# Patient Record
Sex: Male | Born: 1965 | Race: White | Hispanic: No | Marital: Single | Smoking: Never smoker
Health system: Southern US, Community
[De-identification: ages and names within clinical notes are randomized; demographics above are authoritative.]

## PROBLEM LIST (undated history)

## (undated) DIAGNOSIS — K30 Functional dyspepsia: Secondary | ICD-10-CM

## (undated) DIAGNOSIS — E785 Hyperlipidemia, unspecified: Secondary | ICD-10-CM

## (undated) DIAGNOSIS — N529 Male erectile dysfunction, unspecified: Secondary | ICD-10-CM

## (undated) DIAGNOSIS — Z8249 Family history of ischemic heart disease and other diseases of the circulatory system: Secondary | ICD-10-CM

## (undated) DIAGNOSIS — C801 Malignant (primary) neoplasm, unspecified: Secondary | ICD-10-CM

## (undated) DIAGNOSIS — R931 Abnormal findings on diagnostic imaging of heart and coronary circulation: Secondary | ICD-10-CM

## (undated) DIAGNOSIS — J309 Allergic rhinitis, unspecified: Secondary | ICD-10-CM

## (undated) HISTORY — DX: Hyperlipidemia, unspecified: E78.5

## (undated) HISTORY — DX: Male erectile dysfunction, unspecified: N52.9

## (undated) HISTORY — DX: Allergic rhinitis, unspecified: J30.9

## (undated) HISTORY — DX: Malignant (primary) neoplasm, unspecified: C80.1

## (undated) HISTORY — DX: Abnormal findings on diagnostic imaging of heart and coronary circulation: R93.1

## (undated) HISTORY — DX: Family history of ischemic heart disease and other diseases of the circulatory system: Z82.49

---

## 2002-10-03 ENCOUNTER — Ambulatory Visit (HOSPITAL_COMMUNITY): Admission: RE | Admit: 2002-10-03 | Discharge: 2002-10-03 | Payer: Self-pay | Admitting: Gastroenterology

## 2002-10-03 ENCOUNTER — Encounter (INDEPENDENT_AMBULATORY_CARE_PROVIDER_SITE_OTHER): Payer: Self-pay | Admitting: Specialist

## 2004-06-17 ENCOUNTER — Encounter: Admission: RE | Admit: 2004-06-17 | Discharge: 2004-06-17 | Payer: Self-pay | Admitting: Family Medicine

## 2005-07-31 HISTORY — PX: COLONOSCOPY: SHX174

## 2006-06-08 ENCOUNTER — Ambulatory Visit: Payer: Self-pay | Admitting: Family Medicine

## 2006-06-20 ENCOUNTER — Ambulatory Visit: Payer: Self-pay | Admitting: Family Medicine

## 2006-08-02 ENCOUNTER — Ambulatory Visit: Payer: Self-pay | Admitting: Family Medicine

## 2006-08-21 ENCOUNTER — Ambulatory Visit: Payer: Self-pay | Admitting: Family Medicine

## 2006-08-22 ENCOUNTER — Encounter: Admission: RE | Admit: 2006-08-22 | Discharge: 2006-08-22 | Payer: Self-pay | Admitting: Family Medicine

## 2006-09-18 ENCOUNTER — Ambulatory Visit: Payer: Self-pay | Admitting: Family Medicine

## 2006-11-12 ENCOUNTER — Ambulatory Visit: Payer: Self-pay | Admitting: Family Medicine

## 2007-02-11 ENCOUNTER — Ambulatory Visit: Payer: Self-pay | Admitting: Family Medicine

## 2008-04-07 ENCOUNTER — Ambulatory Visit: Payer: Self-pay | Admitting: Family Medicine

## 2008-09-29 ENCOUNTER — Ambulatory Visit: Payer: Self-pay | Admitting: Family Medicine

## 2008-12-07 ENCOUNTER — Ambulatory Visit: Payer: Self-pay | Admitting: Family Medicine

## 2009-02-08 ENCOUNTER — Ambulatory Visit: Payer: Self-pay | Admitting: Family Medicine

## 2009-06-29 ENCOUNTER — Ambulatory Visit: Payer: Self-pay | Admitting: Family Medicine

## 2009-08-23 ENCOUNTER — Ambulatory Visit: Payer: Self-pay | Admitting: Family Medicine

## 2009-09-06 ENCOUNTER — Encounter: Admission: RE | Admit: 2009-09-06 | Discharge: 2009-09-06 | Payer: Self-pay | Admitting: Family Medicine

## 2009-09-15 ENCOUNTER — Ambulatory Visit: Payer: Self-pay | Admitting: Family Medicine

## 2009-12-08 ENCOUNTER — Ambulatory Visit: Payer: Self-pay | Admitting: Family Medicine

## 2010-09-16 ENCOUNTER — Ambulatory Visit (INDEPENDENT_AMBULATORY_CARE_PROVIDER_SITE_OTHER): Payer: 59 | Admitting: Family Medicine

## 2010-09-16 DIAGNOSIS — J359 Chronic disease of tonsils and adenoids, unspecified: Secondary | ICD-10-CM

## 2010-09-16 DIAGNOSIS — J069 Acute upper respiratory infection, unspecified: Secondary | ICD-10-CM

## 2010-10-17 ENCOUNTER — Ambulatory Visit: Payer: 59 | Admitting: Family Medicine

## 2010-12-16 NOTE — Op Note (Signed)
   NAME:  Adrian Mcneil, Adrian Mcneil                    ACCOUNT NO.:  [id number]   MEDICAL RECORD NO.:  [id number]                   PATIENT TYPE:  AMB   LOCATION:  ENDO                                 FACILITY:  [facility]   PHYSICIAN:  James L. Rolla Flatten., M.D.          DATE OF BIRTH:  1965-12-31   DATE OF PROCEDURE:  10/03/2002  DATE OF DISCHARGE:                                 OPERATIVE REPORT   PROCEDURE:  Colonoscopy and biopsy.   MEDICATIONS:  Fentanyl 125 mcg, Versed 12 mg IV.   SCOPE:  Olympus adult pediatric colonoscope.   INDICATIONS FOR PROCEDURE:  A patient with persistent diarrhea, rectal  bleeding, lots of gas. For this reason, colonoscopy is performed.   DESCRIPTION OF PROCEDURE:  The procedure had been explained to the patient  and consent obtained. With the patient in the left lateral decubitus  position, the Olympus scope was inserted and advanced under direct  visualization. The prep was quite good.  The patient immediately was seen to  have a very edematous friable colonic mucosa with what appeared to be either  mucus or exudate but no frank ulcerations. This process extended throughout  the entire colon. The ileocecal valve was identified and entered. The  terminal ileum did not appear to be normal, it appeared to be edematous and  friable. Several biopsies were taken of the terminal ileum. No ulcerations  were seen. These were placed in jar #1. The scope was then withdrawn and the  mucosa photographed throughout. Multiple biopsies were obtained and placed  in jar #2. These were random colon biopsies. The process extended throughout  the entire colon. There was complete loss of vascular pattern with mucosal  edema friability with what appeared to be exudate but no frank ulcerations.  The patient tolerated the procedure well and was maintained on low flow  oxygen and pulse oximeter throughout the procedure.   ASSESSMENT:  Probable some variant of inflammatory bowel  disease.   PLAN:  Will begin the patient on Asacol, check path results and see him in  back in our office in 3-4 weeks.                                               James L. Rolla Flatten., M.D.    Jaynie Bream  D:  10/03/2002  T:  10/03/2002  Job:  [id number]   cc:   Jill Alexanders, M.D.  [address] [place], [place] [address]  Fax: [phone]

## 2011-02-20 ENCOUNTER — Other Ambulatory Visit: Payer: Self-pay | Admitting: Family Medicine

## 2011-07-24 ENCOUNTER — Ambulatory Visit (INDEPENDENT_AMBULATORY_CARE_PROVIDER_SITE_OTHER): Payer: 59 | Admitting: Family Medicine

## 2011-07-24 ENCOUNTER — Encounter: Payer: Self-pay | Admitting: Family Medicine

## 2011-07-24 DIAGNOSIS — Z202 Contact with and (suspected) exposure to infections with a predominantly sexual mode of transmission: Secondary | ICD-10-CM

## 2011-07-24 DIAGNOSIS — Z23 Encounter for immunization: Secondary | ICD-10-CM

## 2011-07-24 NOTE — Progress Notes (Signed)
CC: STD check HPI:  Patient presents for STD check.  Has had oral sex with new partner.  Did not use condoms.  Partner mentioned some tingling discomfort with urinating.  Denies burning with urination, urgency, frequency or penile discharge.  Denies any sores or lesions.  Past Medical History  Diagnosis Date  . Ulcerative colitis     Dr. Oletta Lamas  . Dyslipidemia   . Allergic rhinitis, cause unspecified     seasonal  . Erectile dysfunction    Past Surgical History  Procedure Date  . Colonoscopy 2007    Dr. Oletta Lamas   Current Outpatient Prescriptions on File Prior to Visit  Medication Sig Dispense Refill  . ASACOL 400 MG EC tablet TAKE 2 TABLET BY MOUTH TWICE A DAY CALL FOR APPOINTMENT!!  120 tablet  0   No Known Allergies  ROS:  Denies fevers, URI symptoms, cough, shortness of breath, skin lesions, dysuria, or other concerns  PHYSICAL EXAM: BP 132/100  Pulse 100  Resp 16  Wt 174 lb (78.926 kg) Pleasant male, in no distress HEENT: PERRL, EOMI, conjunctiva clear.  OP clear without erythema or lesions Neck: no lymphadenopathy  ASSESSMENT/PLAN: 1. Exposure to STD  HIV Antibody ( Reflex), RPR, GC/chlamydia probe amp, urine  2. Need for influenza vaccination  Flu vaccine greater than or equal to 3yo preservative free IM   HIV, RPR, GC, chlamydia Encouraged safe sex.  Discussed potential risks with unprotected oral sex.  Lipids not checked today due to him being nonfasting (had [employer]) and his TG was elevated in past.  Recommended he return for CPE with Dr. Redmond School, to come fasting for routine labs then.  Due to f/u with GI for colonoscopy.

## 2011-07-24 NOTE — Patient Instructions (Signed)
You are due for a physical with Dr. Redmond School. I believe you are also due to follow up with Dr. Oletta Lamas for colonoscopy  Please try and practice safe sex

## 2011-07-25 LAB — RPR

## 2011-07-26 LAB — GC/CHLAMYDIA PROBE AMP, URINE: Chlamydia, Swab/Urine, PCR: NEGATIVE

## 2011-08-10 ENCOUNTER — Encounter: Payer: Self-pay | Admitting: Internal Medicine

## 2011-08-18 ENCOUNTER — Telehealth: Payer: Self-pay | Admitting: Family Medicine

## 2011-08-18 MED ORDER — VARDENAFIL HCL 20 MG PO TABS: 20.0000 mg | ORAL_TABLET | Freq: Every day | ORAL | Status: DC | PRN

## 2011-08-18 NOTE — Telephone Encounter (Signed)
Levitra renewed

## 2011-08-22 ENCOUNTER — Ambulatory Visit (INDEPENDENT_AMBULATORY_CARE_PROVIDER_SITE_OTHER): Payer: 59 | Admitting: Family Medicine

## 2011-08-22 ENCOUNTER — Encounter: Payer: Self-pay | Admitting: Family Medicine

## 2011-08-22 VITALS — BP 126/70 | HR 104 | Ht 71.0 in | Wt 175.0 lb

## 2011-08-22 DIAGNOSIS — J301 Allergic rhinitis due to pollen: Secondary | ICD-10-CM

## 2011-08-22 DIAGNOSIS — E785 Hyperlipidemia, unspecified: Secondary | ICD-10-CM | POA: Insufficient documentation

## 2011-08-22 DIAGNOSIS — Z0000 Encounter for general adult medical examination without abnormal findings: Secondary | ICD-10-CM

## 2011-08-22 DIAGNOSIS — Z8249 Family history of ischemic heart disease and other diseases of the circulatory system: Secondary | ICD-10-CM | POA: Insufficient documentation

## 2011-08-22 DIAGNOSIS — N529 Male erectile dysfunction, unspecified: Secondary | ICD-10-CM | POA: Insufficient documentation

## 2011-08-22 DIAGNOSIS — K5190 Ulcerative colitis, unspecified, without complications: Secondary | ICD-10-CM | POA: Insufficient documentation

## 2011-08-22 NOTE — Progress Notes (Signed)
Subjective:    Patient ID: Adrian Mcneil, male    DOB: [birth year], 46 y.o.   MRN: [id number]  HPI He is here for a complete examination. He is scheduled to see his gastroenterologist for followup on his underlying ulcerative colitis. Presently he is not taking any medications for this and states he is not having any trouble except for occasional bouts of hemorrhoids. They do occasionally bleed. He does not suffer from constipation. There is a family history of heart disease and presently he is on no statins. He has had difficulty with upper back discomfort. He is seeing a chiropractor and getting some benefit out of this. He does not smoke and does drink socially. Presently is not sexually active. He does have a previous history of erectile dysfunction. His allergies are under good control.   Review of Systems Negative except as above    Objective:   Physical Exam BP 126/70  Pulse 104  Ht 5' 11"  (1.803 m)  Wt 175 lb (79.379 kg)  BMI 24.41 kg/m2  General Appearance:    Alert, cooperative, no distress, appears stated age  Head:    Normocephalic, without obvious abnormality, atraumatic  Eyes:    PERRL, conjunctiva/corneas clear, EOM's intact, fundi    benign  Ears:    Normal TM's and external ear canals  Nose:   Nares normal, mucosa normal, no drainage or sinus   tenderness  Throat:   Lips, mucosa, and tongue normal; teeth and gums normal  Neck:   Supple, no lymphadenopathy;  thyroid:  no   enlargement/tenderness/nodules; no carotid   bruit or JVD  Back:    Spine nontender, no curvature, ROM normal, no CVA     tenderness  Lungs:     Clear to auscultation bilaterally without wheezes, rales or     ronchi; respirations unlabored  Chest Wall:    No tenderness or deformity   Heart:    Regular rate and rhythm, S1 and S2 normal, no murmur, rub   or gallop  Breast Exam:    No chest wall tenderness, masses or gynecomastia  Abdomen:     Soft, non-tender, nondistended, normoactive bowel  sounds,    no masses, no hepatosplenomegaly  Genitalia:    Normal male external genitalia without lesions.  Testicles without masses.  No inguinal hernias.  Rectal:    pedunculated lesions noted in the anal area there do not appear to be true hemorrhoids.   Extremities:   No clubbing, cyanosis or edema  Pulses:   2+ and symmetric all extremities  Skin:   Skin color, texture, turgor normal, no rashes or lesions  Lymph nodes:   Cervical, supraclavicular, and axillary nodes normal  Neurologic:   CNII-XII intact, normal strength, sensation and gait; reflexes 2+ and symmetric throughout          Psych:   Normal mood, affect, hygiene and grooming.           Assessment & Plan:   1. Routine general medical examination at a health care facility  CBC with Differential, Comprehensive metabolic panel, Lipid panel  2. Family history of heart disease in male family member before age 38    3. Ulcerative colitis    4. Hyperlipidemia LDL goal < 100  Lipid panel  5. ED (erectile dysfunction)    6. Allergic rhinitis due to pollen     he will followup with his gastroenterologist. I recommended that he ask the gastroenterologist about the lesions in the rectal area. Recommend  he continue with stretching exercises for his neck and back. Also discussed proper posturing. We discussed erectile dysfunction and at this time will defer any medications until a later date when it becomes more sexually active.

## 2011-08-23 LAB — LIPID PANEL
LDL Cholesterol: 173 mg/dL — ABNORMAL HIGH (ref 0–99)
Total CHOL/HDL Ratio: 5.6 Ratio
Triglycerides: 114 mg/dL (ref <150)
VLDL: 23 mg/dL (ref 0–40)

## 2011-08-23 LAB — CBC WITH DIFFERENTIAL/PLATELET
Basophils Relative: 0 % (ref 0–1)
Eosinophils Absolute: 0.1 10*3/uL (ref 0.0–0.7)
HCT: 42.5 % (ref 39.0–52.0)
MCH: 30.3 pg (ref 26.0–34.0)
MCHC: 33.9 g/dL (ref 30.0–36.0)
MCV: 89.5 fL (ref 78.0–100.0)
Monocytes Absolute: 0.7 10*3/uL (ref 0.1–1.0)
Neutro Abs: 6.2 10*3/uL (ref 1.7–7.7)
Neutrophils Relative %: 70 % (ref 43–77)
Platelets: 284 10*3/uL (ref 150–400)
RBC: 4.75 MIL/uL (ref 4.22–5.81)
RDW: 12.6 % (ref 11.5–15.5)
WBC: 8.9 10*3/uL (ref 4.0–10.5)

## 2011-08-23 LAB — COMPREHENSIVE METABOLIC PANEL
ALT: 16 U/L (ref 0–53)
AST: 17 U/L (ref 0–37)
Albumin: 4.6 g/dL (ref 3.5–5.2)
Alkaline Phosphatase: 87 U/L (ref 39–117)
CO2: 28 mEq/L (ref 19–32)
Calcium: 9.6 mg/dL (ref 8.4–10.5)
Chloride: 100 mEq/L (ref 96–112)
Glucose, Bld: 83 mg/dL (ref 70–99)
Potassium: 4.8 mEq/L (ref 3.5–5.3)
Sodium: 139 mEq/L (ref 135–145)
Total Bilirubin: 0.7 mg/dL (ref 0.3–1.2)
Total Protein: 7.8 g/dL (ref 6.0–8.3)

## 2011-08-23 NOTE — Progress Notes (Signed)
Quick Note:  The blood work is normal ______

## 2011-08-31 ENCOUNTER — Other Ambulatory Visit: Payer: Self-pay | Admitting: Gastroenterology

## 2011-10-09 ENCOUNTER — Ambulatory Visit (INDEPENDENT_AMBULATORY_CARE_PROVIDER_SITE_OTHER): Payer: 59 | Admitting: General Surgery

## 2011-10-09 ENCOUNTER — Encounter (INDEPENDENT_AMBULATORY_CARE_PROVIDER_SITE_OTHER): Payer: Self-pay | Admitting: General Surgery

## 2011-10-09 VITALS — BP 126/74 | HR 106 | Temp 98.6°F | Ht 71.0 in | Wt 177.8 lb

## 2011-10-09 DIAGNOSIS — K644 Residual hemorrhoidal skin tags: Secondary | ICD-10-CM

## 2011-10-09 NOTE — Patient Instructions (Signed)
Your examination today reveals circumferential, but non-inflamed external hemorrhoids. Internal exam doesn't reveal much in the way of abnormality.  You will be scheduled for surgery to remove the external hemorrhoids.  Hemorrhoidectomy Hemorrhoidectomy is surgery to remove hemorrhoids. Hemorrhoids are veins that have become swollen in the rectum. The rectum is the area from the bottom end of the intestines to the opening where bowel movements leave the body. Hemorrhoids can be uncomfortable. They can cause itching, bleeding and pain if a blood clot forms in them (thrombose). If hemorrhoids are small, surgery may not be needed. But if they cover a larger area, surgery is usually suggested.  LET YOUR CAREGIVER KNOW ABOUT:   Any allergies.   All medications you are taking, including:   Herbs, eyedrops, over-the-counter medications and creams.   Blood thinners (anticoagulants), aspirin or other drugs that could affect blood clotting.   Use of steroids (by mouth or as creams).   Previous problems with anesthetics, including local anesthetics.   Possibility of pregnancy, if this applies.   Any history of blood clots.   Any history of bleeding or other blood problems.   Previous surgery.   Smoking history.   Other health problems.  RISKS AND COMPLICATIONS All surgery carries some risk. However, hemorrhoid surgery usually goes smoothly. Possible complications could include:  Urinary retention.   Bleeding.   Infection.   A painful incision.   A reaction to the anesthesia (this is not common).  BEFORE THE PROCEDURE   Stop using aspirin and non-steroidal anti-inflammatory drugs (NSAIDs) for pain relief. This includes prescription drugs and over-the-counter drugs such as ibuprofen and naproxen. Also stop taking vitamin E. If possible, do this two weeks before your surgery.   If you take blood-thinners, ask your healthcare provider when you should stop taking them.   You will  probably have blood and urine tests done several days before your surgery.   Do not eat or drink for about 8 hours before the surgery.   Arrive at least an hour before the surgery, or whenever your surgeon recommends. This will give you time to check in and fill out any needed paperwork.   Hemorrhoidectomy is often an outpatient procedure. This means you will be able to go home the same day. Sometimes, though, people stay overnight in the hospital after the procedure. Ask your surgeon what to expect. Either way, make arrangements in advance for someone to drive you home.  PROCEDURE   The preparation:   You will change into a hospital gown.   You will be given an IV. A needle will be inserted in your arm. Medication can flow directly into your body through this needle.   You might be given an enema to clear your rectum.   Once in the operating room, you will probably lie on your side or be repositioned later to lying on your stomach.   You will be given anesthesia (medication) so you will not feel anything during the surgery. The surgery often is done with local anesthesia (the area near the hemorrhoids will be numb and you will be drowsy but awake). Sometimes, general anesthesia is used (you will be asleep during the procedure).   The procedure:   There are a few different procedures for hemorrhoids. Be sure to ask you surgeon about the procedure, the risks and benefits.   Be sure to ask about what you need to do to take care of the wound, if there is one.  AFTER THE PROCEDURE  You will stay in a recovery area until the anesthesia has worn off. Your blood pressure and pulse will be checked every so often.   You may feel a lot of pain in the area of the rectum.   Take all pain medication prescribed by your surgeon. Ask before taking any over-the-counter pain medicines.   Sometimes sitting in a warm bath can help relieve your pain.   To make sure you have bowel movements without  straining:   You will probably need to take stool softeners (usually a pill) for a few days.   You should drink 8 to 10 glasses of water each day.   Your activity will be restricted for awhile. Ask your caregiver for a list of what you should and should not do while you recover.  Document Released: 05/14/2009 Document Revised: 07/06/2011 Document Reviewed: 05/14/2009 [facility] Patient Information 2012 [place].

## 2011-10-09 NOTE — Progress Notes (Signed)
Patient ID: Adrian Mcneil, male   DOB: 1965-12-20, 46 y.o.   MRN: [id number]  Chief Complaint  Patient presents with  . Pre-op Exam    eval anal polyps   . HPI Adrian Mcneil is a 46 y.o. male.  He is referred by Dr. Laurence Spates for evaluation of anal polyps and hemorrhoids. Dr. Jill Alexanders is his primary care physician.  This patient has ulcerative colitis, which was diagnosed in 2004. He has intermittently been on medication for this. He is not very symptomatic from that.  His complaints are external skin tags and or polyps. These do not retract. They are not prolapsing. They are always external. Occasionally they will become engorged and will bleed and are irritated. He uses cream to keep it moist and avoid this.  Recent colonoscopy on August 31, 2011 reveals chronic active colitis both right and left side but no dysplasia. There were no internal hemorrhoids on colonoscopy. He was started back on Asacol at that time.  He would like to have something done about the external polyps or hemorrhoids. HPI  Past Medical History  Diagnosis Date  . Ulcerative colitis     Dr. Oletta Lamas  . Dyslipidemia   . Allergic rhinitis, cause unspecified     seasonal  . Erectile dysfunction   . FHx: cardiovascular disease   . Hyperlipidemia   . Cancer     skin    Past Surgical History  Procedure Date  . Colonoscopy 2007    Dr. Oletta Lamas    Family History  Problem Relation Age of Onset  . Hyperlipidemia Mother   . Heart disease Father 79    MI  . Cancer Sister     skin    Social History History  Substance Use Topics  . Smoking status: Never Smoker   . Smokeless tobacco: Never Used  . Alcohol Use: Yes     socially, 1-2 drinks once or twice a week    No Known Allergies  Current Outpatient Prescriptions  Medication Sig Dispense Refill  . ASACOL 400 MG EC tablet TAKE 2 TABLET BY MOUTH TWICE A DAY CALL FOR APPOINTMENT!!  120 tablet  0    Review of Systems Review of  Systems  Constitutional: Negative for fever, chills and unexpected weight change.  HENT: Negative for hearing loss, congestion, sore throat, trouble swallowing and voice change.   Eyes: Negative for visual disturbance.  Respiratory: Negative for cough and wheezing.   Cardiovascular: Negative for chest pain, palpitations and leg swelling.  Gastrointestinal: Positive for anal bleeding. Negative for nausea, vomiting, abdominal pain, diarrhea, constipation, blood in stool, abdominal distention and rectal pain.  Genitourinary: Negative for hematuria and difficulty urinating.  Musculoskeletal: Negative for arthralgias.  Skin: Negative for rash and wound.  Neurological: Negative for seizures, syncope, weakness and headaches.  Hematological: Negative for adenopathy. Does not bruise/bleed easily.  Psychiatric/Behavioral: Negative for confusion.    Blood pressure 126/74, pulse 106, temperature 98.6 F (37 C), temperature source Temporal, height 5' 11"  (1.803 m), weight 177 lb 12.8 oz (80.65 kg), SpO2 98.00%.  Physical Exam Physical Exam  Constitutional: He is oriented to person, place, and time. He appears well-developed and well-nourished. No distress.  HENT:  Head: Normocephalic.  Nose: Nose normal.  Mouth/Throat: No oropharyngeal exudate.  Eyes: Conjunctivae and EOM are normal. Pupils are equal, round, and reactive to light. Right eye exhibits no discharge. Left eye exhibits no discharge. No scleral icterus.  Neck: Normal range of motion. Neck supple. No  JVD present. No tracheal deviation present. No thyromegaly present.  Cardiovascular: Normal rate, regular rhythm, normal heart sounds and intact distal pulses.   No murmur heard. Pulmonary/Chest: Effort normal and breath sounds normal. No stridor. No respiratory distress. He has no wheezes. He has no rales. He exhibits no tenderness.  Abdominal: Soft. Bowel sounds are normal. He exhibits no distension and no mass. There is no tenderness. There  is no rebound and no guarding.  Genitourinary:       Circumferential external hemorrhoids. These are not inflamed, not thrombosed. Not very large. Digital rectal exam reveals normal sphincter tone, no mass no tenderness. Anoscopy reveals fairly normal anorectal mucosa. Minimal internal hemorrhoids.  Musculoskeletal: Normal range of motion. He exhibits no edema and no tenderness.  Lymphadenopathy:    He has no cervical adenopathy.  Neurological: He is alert and oriented to person, place, and time. He has normal reflexes. Coordination normal.  Skin: Skin is warm and dry. No rash noted. He is not diaphoretic. No erythema. No pallor.  Psychiatric: He has a normal mood and affect. His behavior is normal. Judgment and thought content normal.    Data Reviewed  I reviewed Dr. Oletta Lamas office notes, pathology reports, a recent colonoscopy report. Assessment    External hemorrhoids with complication of inflammation and bleeding.  Minimal, if any internal hemorrhoids. Suspect his symptoms are due to the external hemorrhoids.  Ulcerative colitis without dysplasia, on asacol  Otherwise healthy    Plan    We discussed options for management. He clearly desires to have these areas excised, which I think is reasonable.  Will plan for examination under anesthesia, excision of external hemorrhoids as an outpatient.  I have discussed the indications and details of surgery with him. Risks and complications have been outlined, including but not limited to bleeding, infection, recurrence, sphincter damage, and other unspecified problems. He knows that he does not heal quite as well as an average person because of his ulcerative colitis. All questions are answered. He understands these issues. He agrees with the plan.       [name], M.D., [facility] General and Minimally invasive Surgery Breast and Colorectal Surgery Office:   [phone] Pager:    [phone]  10/09/2011, 11:38 AM

## 2011-11-02 ENCOUNTER — Telehealth (INDEPENDENT_AMBULATORY_CARE_PROVIDER_SITE_OTHER): Payer: Self-pay

## 2011-11-02 NOTE — Telephone Encounter (Signed)
Patient called concerning pre op labs and UA that need to be done. He said he cannot make it to get these done before surgery on 4/12. He said he had lab work done in January by PCP and is having those faxed over to surgical center to see if they can use those instead of having new labs drawn. I told him I would get this message to Dr. Dalbert Batman and his nurse and they would call you with what needs to be done. Best number to reach him at is [phone].

## 2011-11-03 ENCOUNTER — Telehealth (INDEPENDENT_AMBULATORY_CARE_PROVIDER_SITE_OTHER): Payer: Self-pay

## 2011-11-03 NOTE — Telephone Encounter (Signed)
Pt advised ok with Dr Dalbert Batman to use Jan labs if anesthesia is ok with them. Pt states they have been sent to surg center for review.

## 2011-11-03 NOTE — Telephone Encounter (Signed)
Message copied by Dois Davenport on Fri Nov 03, 2011  1:56 PM ------      Message from: Adin Hector      Created: Fri Nov 03, 2011  1:44 PM       I'm OK with Jan. Labs if anesthesia is.                  ----- Message -----         From: Dois Davenport, LPN         Sent: 01/01/159   9:26 AM           To: Adin Hector, MD            [name] had sent to our box yesterday morning a note wanting to know if ok for pt to use his Jan labs from his pcp as his pre op labs. Pt could not get off work to have pre op labs drawn. I saw where to note was routed to you but did not see it or an answer in your in box. If anesthesia is ok with them can we not redraw labs? Surgery is 4-12.

## 2011-11-10 ENCOUNTER — Other Ambulatory Visit (INDEPENDENT_AMBULATORY_CARE_PROVIDER_SITE_OTHER): Payer: Self-pay | Admitting: General Surgery

## 2011-11-10 DIAGNOSIS — K648 Other hemorrhoids: Secondary | ICD-10-CM

## 2011-11-10 DIAGNOSIS — K644 Residual hemorrhoidal skin tags: Secondary | ICD-10-CM

## 2011-11-13 ENCOUNTER — Telehealth (INDEPENDENT_AMBULATORY_CARE_PROVIDER_SITE_OTHER): Payer: Self-pay | Admitting: General Surgery

## 2011-11-13 NOTE — Telephone Encounter (Signed)
Pt calling to ask is there is anything to apply locally for pain.  He is using the warm tub soaks, and taking Miralax, but he "is raw."  He is using baby wipes, as recommended, too.  Pt states his last pain meds were 24 hours ago, because of the constipation side effects.  Reiterated the things he is doing now to continue doing (tub soaks, Miralax and baby wipes); added he can also try A&D ointment externally only.  Advised to resume the po pain meds.

## 2011-11-14 ENCOUNTER — Encounter (INDEPENDENT_AMBULATORY_CARE_PROVIDER_SITE_OTHER): Payer: Self-pay | Admitting: General Surgery

## 2011-11-15 ENCOUNTER — Telehealth (INDEPENDENT_AMBULATORY_CARE_PROVIDER_SITE_OTHER): Payer: Self-pay

## 2011-11-15 NOTE — Telephone Encounter (Signed)
LMOM f/u appt 12-08-11 arrive 10:15am.

## 2011-11-20 ENCOUNTER — Telehealth (INDEPENDENT_AMBULATORY_CARE_PROVIDER_SITE_OTHER): Payer: Self-pay

## 2011-11-20 NOTE — Telephone Encounter (Signed)
Pt states he has an open area at anus where suture has come out. No swelling. No fever. No drainage.  He wanted to know if this was to be expected. Pt advised that the sutures will dissolve and come out as time goes on and he may have small raw feeling area where the sutures were. Pt states he has been reading online about wound healing and wants to know if applying antibiotic or A&D ointment will help since area sore if it gets dry. I advised pt to use A&D in a very small amount on area if it gives him relief. Pt advised to be sure to continue tub soaks at least twice a day and use baby wet wipes to clean area. Pt to call with any concerns.

## 2011-11-24 ENCOUNTER — Encounter: Payer: Self-pay | Admitting: Gastroenterology

## 2011-12-04 ENCOUNTER — Encounter: Payer: Self-pay | Admitting: Gastroenterology

## 2011-12-08 ENCOUNTER — Encounter (INDEPENDENT_AMBULATORY_CARE_PROVIDER_SITE_OTHER): Payer: 59 | Admitting: General Surgery

## 2012-02-15 ENCOUNTER — Encounter (INDEPENDENT_AMBULATORY_CARE_PROVIDER_SITE_OTHER): Payer: Self-pay

## 2012-06-11 ENCOUNTER — Ambulatory Visit (INDEPENDENT_AMBULATORY_CARE_PROVIDER_SITE_OTHER): Payer: 59 | Admitting: Family Medicine

## 2012-06-11 ENCOUNTER — Encounter: Payer: Self-pay | Admitting: Family Medicine

## 2012-06-11 VITALS — BP 116/80 | HR 71 | Wt 176.0 lb

## 2012-06-11 DIAGNOSIS — Z23 Encounter for immunization: Secondary | ICD-10-CM

## 2012-06-11 DIAGNOSIS — N529 Male erectile dysfunction, unspecified: Secondary | ICD-10-CM

## 2012-06-11 DIAGNOSIS — E785 Hyperlipidemia, unspecified: Secondary | ICD-10-CM

## 2012-06-11 DIAGNOSIS — Z8249 Family history of ischemic heart disease and other diseases of the circulatory system: Secondary | ICD-10-CM

## 2012-06-11 LAB — CBC WITH DIFFERENTIAL/PLATELET
Basophils Absolute: 0.1 10*3/uL (ref 0.0–0.1)
Basophils Relative: 1 % (ref 0–1)
Eosinophils Absolute: 0.2 10*3/uL (ref 0.0–0.7)
Eosinophils Relative: 2 % (ref 0–5)
HCT: 41.6 % (ref 39.0–52.0)
Hemoglobin: 14.3 g/dL (ref 13.0–17.0)
Lymphocytes Relative: 25 % (ref 12–46)
Lymphs Abs: 2.2 10*3/uL (ref 0.7–4.0)
MCH: 30.6 pg (ref 26.0–34.0)
MCV: 89.1 fL (ref 78.0–100.0)
Monocytes Absolute: 0.7 10*3/uL (ref 0.1–1.0)
Monocytes Relative: 8 % (ref 3–12)
Neutro Abs: 5.4 10*3/uL (ref 1.7–7.7)
Neutrophils Relative %: 64 % (ref 43–77)
Platelets: 251 10*3/uL (ref 150–400)
RBC: 4.67 MIL/uL (ref 4.22–5.81)
RDW: 13 % (ref 11.5–15.5)
WBC: 8.5 10*3/uL (ref 4.0–10.5)

## 2012-06-11 NOTE — Progress Notes (Signed)
  Subjective:    Patient ID: Adrian Mcneil, male    DOB: 1966/07/30, 46 y.o.   MRN: [id number]  HPI He is here for consult concerning cardiovascular risk stratification. His father did have an MI at age 29. Review his record indicates he did have a slightly abnormal EKG however cardiac evaluation was negative. He was also placed on a statin however did not take it but is now taking red yeast rice. He also is had some intermittent difficulty with erectile dysfunction but has had unacceptable side effects of nasal congestion as well as backache. He has had no chest pain, shortness of breath, DOE or PND.   Review of Systems     Objective:   Physical Exam Alert and in no distress. Cardiac exam shows a regular sinus rhythm without murmurs or gallops. Lungs clear to auscultation. EKG shows no acute changes.       Assessment & Plan:   1. Hyperlipidemia LDL goal < 100  Lipid panel, CBC with Differential, Comprehensive metabolic panel  2. Family history of heart disease in male family member before age 64  Lipid panel, PR ELECTROCARDIOGRAM, COMPLETE, Flu vaccine greater than or equal to 3yo preservative free IM  3. ED (erectile dysfunction)     sample of Cialis given with risks and benefits described. We'll also give him a flu shot. Risk and benefits discussed. Discussed the possibility of referring him to cardiology. Explained that there was no particular protocol however he is really having no cardiac related symptoms at this time.

## 2012-06-12 LAB — COMPREHENSIVE METABOLIC PANEL
AST: 23 U/L (ref 0–37)
Albumin: 4.4 g/dL (ref 3.5–5.2)
Alkaline Phosphatase: 97 U/L (ref 39–117)
BUN: 16 mg/dL (ref 6–23)
CO2: 29 mEq/L (ref 19–32)
Calcium: 9.5 mg/dL (ref 8.4–10.5)
Chloride: 102 mEq/L (ref 96–112)
Creat: 0.94 mg/dL (ref 0.50–1.35)
Glucose, Bld: 81 mg/dL (ref 70–99)
Potassium: 4.4 mEq/L (ref 3.5–5.3)
Sodium: 139 mEq/L (ref 135–145)
Total Bilirubin: 0.4 mg/dL (ref 0.3–1.2)
Total Protein: 7.5 g/dL (ref 6.0–8.3)

## 2012-06-12 LAB — LIPID PANEL
Cholesterol: 213 mg/dL — ABNORMAL HIGH (ref 0–200)
HDL: 43 mg/dL (ref >39)
LDL Cholesterol: 112 mg/dL — ABNORMAL HIGH (ref 0–99)
VLDL: 58 mg/dL — ABNORMAL HIGH (ref 0–40)

## 2012-06-12 NOTE — Progress Notes (Signed)
Quick Note:  Let him know that the LDL is lower but the triglycerides went up. Send him triglyceride lowering information ______

## 2012-06-21 ENCOUNTER — Other Ambulatory Visit: Payer: Self-pay

## 2012-06-21 DIAGNOSIS — Z8639 Personal history of other endocrine, nutritional and metabolic disease: Secondary | ICD-10-CM

## 2012-06-21 NOTE — Progress Notes (Signed)
Pt last office visit he said he had eaten 4 hrs prior he is going to start a low cholesterol diet and recheck in 1 mth

## 2012-07-04 ENCOUNTER — Ambulatory Visit: Payer: 59 | Admitting: Family Medicine

## 2012-12-16 ENCOUNTER — Encounter: Payer: Self-pay | Admitting: Family Medicine

## 2012-12-16 ENCOUNTER — Ambulatory Visit (INDEPENDENT_AMBULATORY_CARE_PROVIDER_SITE_OTHER): Payer: 59 | Admitting: Family Medicine

## 2012-12-16 VITALS — BP 120/80 | HR 90 | Temp 98.2°F | Wt 169.0 lb

## 2012-12-16 DIAGNOSIS — R05 Cough: Secondary | ICD-10-CM

## 2012-12-16 DIAGNOSIS — R059 Cough, unspecified: Secondary | ICD-10-CM

## 2012-12-16 MED ORDER — ALBUTEROL SULFATE HFA 108 (90 BASE) MCG/ACT IN AERS: 2.0000 | INHALATION_SPRAY | Freq: Four times a day (QID) | RESPIRATORY_TRACT | Status: DC | PRN

## 2012-12-16 NOTE — Progress Notes (Signed)
  Subjective:    Patient ID: Adrian Mcneil, male    DOB: 1966/07/23, 47 y.o.   MRN: [id number]  HPI He has a 4 week history of dry cough.no sneezing, itchy watery eyes, PND, rhinorrhea. He does have springtime allergies but usually in March and April. He notes no change with food or with physical activity.   Review of Systems     Objective:   Physical Exam alert and in no distress. Tympanic membranes and canals are normal. Throat is clear. Tonsils are normal. Neck is supple without adenopathy or thyromegaly. Cardiac exam shows a regular sinus rhythm without murmurs or gallops. Lungs are clear to auscultation.         Assessment & Plan:  Cough I have him try albuterol in the office and after 15 minutes, his cough was diminished. I will have him stay on this for the time being to see if we can break the cycle. Discussed continued use of this and at this time I do not anticipate the need to. If indeed that is the case, further evaluation will be needed.

## 2012-12-16 NOTE — Patient Instructions (Addendum)
The aggressive with the use of the medication for the next week and see if that'll been go away. If you continue to have difficulty than I will see you again  You can use the medicine 2 puffs 4 times per day as needed

## 2012-12-26 ENCOUNTER — Encounter: Payer: Self-pay | Admitting: Family Medicine

## 2012-12-26 ENCOUNTER — Ambulatory Visit (INDEPENDENT_AMBULATORY_CARE_PROVIDER_SITE_OTHER): Payer: 59 | Admitting: Family Medicine

## 2012-12-26 VITALS — BP 120/70 | HR 97 | Wt 170.0 lb

## 2012-12-26 DIAGNOSIS — J301 Allergic rhinitis due to pollen: Secondary | ICD-10-CM

## 2012-12-26 DIAGNOSIS — J45909 Unspecified asthma, uncomplicated: Secondary | ICD-10-CM

## 2012-12-26 MED ORDER — MOMETASONE FUROATE 110 MCG/INH IN AEPB: 1.0000 | INHALATION_SPRAY | RESPIRATORY_TRACT | Status: DC

## 2012-12-26 NOTE — Progress Notes (Signed)
  Subjective:    Patient ID: Adrian Mcneil, male    DOB: September 18, 1965, 47 y.o.   MRN: [id number]  HPI He is here for recheck. When he was on a recent trip to [place] his coughing went away and when he returned, the cough reoccurred.he was using the albuterol 2 or 3 times per day. He thought this might possibly be reflux related. He has no sneezing, itchy watery eyes, rhinorrhea. He does have springtime allergies but usually no pulmonary symptoms with it and they usually are gone by May 1He does not smoke.   Review of Systems     Objective:   Physical Exam Alert and in no distress. Lungs are clear to auscultation. Cardiac exam shows regular rhythm without murmurs or gallops.       Assessment & Plan:  Allergic rhinitis due to pollen  Extrinsic asthma, unspecified - Plan: Mometasone Furoate (ASMANEX 30 METERED DOSES) 110 MCG/INH AEPB he will use this for the next month however if he gets worse or the symptoms recur when he stops, I will get blood work and x-rays as well as refer for further evaluation

## 2012-12-26 NOTE — Patient Instructions (Addendum)
Use a new medicine which is Asmanex daily and you can use the albuterol as needed. I'm only giving you a months worth of this and if you continued to need it we'll refer you for further testing.

## 2013-01-07 ENCOUNTER — Other Ambulatory Visit: Payer: Self-pay | Admitting: Family Medicine

## 2013-01-07 MED ORDER — VARDENAFIL HCL 20 MG PO TABS: 20.0000 mg | ORAL_TABLET | Freq: Every day | ORAL | Status: DC | PRN

## 2013-04-07 ENCOUNTER — Other Ambulatory Visit: Payer: Self-pay | Admitting: Family Medicine

## 2013-04-08 NOTE — Telephone Encounter (Signed)
IS THIS OKAY

## 2013-07-01 ENCOUNTER — Telehealth: Payer: Self-pay | Admitting: Family Medicine

## 2013-07-07 MED ORDER — TADALAFIL 20 MG PO TABS: 20.0000 mg | ORAL_TABLET | Freq: Every day | ORAL | Status: DC | PRN

## 2013-07-07 NOTE — Telephone Encounter (Signed)
He was switched to Cialis for better formulary coverage

## 2013-07-07 NOTE — Telephone Encounter (Signed)
Pls change pt to Cialis, this is formulary alternative

## 2013-07-23 ENCOUNTER — Ambulatory Visit (INDEPENDENT_AMBULATORY_CARE_PROVIDER_SITE_OTHER): Payer: 59 | Admitting: Family Medicine

## 2013-07-23 DIAGNOSIS — Z209 Contact with and (suspected) exposure to unspecified communicable disease: Secondary | ICD-10-CM

## 2013-07-23 NOTE — Progress Notes (Signed)
   Subjective:    Patient ID: Adrian Mcneil, male    DOB: 03/30/1966, 47 y.o.   MRN: [id number]  HPI He is here for consult concerning potential STD exposure. He did have some sexual activity a little over a week ago however he is having no discharge or dysuria. He did note a lesion on the shaft of the penis near a superficial vein.   Review of Systems     Objective:   Physical Exam Exam of the penis shows slight trauma to a superficial penile vein on the right side of the shaft.       Assessment & Plan:  Contact with or exposure to unspecified communicable disease - Plan: GC/chlamydia probe amp, urine  I reassured him that the lesion on his penis is nothing to be concerned about and is just slight, to a superficial vein.

## 2013-07-24 LAB — GC/CHLAMYDIA PROBE AMP, URINE
Chlamydia, Swab/Urine, PCR: NEGATIVE
GC Probe Amp, Urine: NEGATIVE

## 2013-09-24 ENCOUNTER — Encounter: Payer: Self-pay | Admitting: Family Medicine

## 2013-09-24 ENCOUNTER — Ambulatory Visit (INDEPENDENT_AMBULATORY_CARE_PROVIDER_SITE_OTHER): Payer: 59 | Admitting: Family Medicine

## 2013-09-24 VITALS — BP 110/82 | HR 72 | Ht 71.0 in | Wt 177.0 lb

## 2013-09-24 DIAGNOSIS — L259 Unspecified contact dermatitis, unspecified cause: Secondary | ICD-10-CM

## 2013-09-24 DIAGNOSIS — L309 Dermatitis, unspecified: Secondary | ICD-10-CM

## 2013-09-24 NOTE — Patient Instructions (Signed)
Use cortisone cream sparingly twice per day for the next couple weeks

## 2013-09-24 NOTE — Progress Notes (Signed)
   Subjective:    Patient ID: Adrian Mcneil, male    DOB: 03-Oct-1965, 48 y.o.   MRN: [id number]  HPI He is here for evaluation of right scrotal itching and slight discomfort that he is noted over the last several days. He notes no lesions anywhere else.   Review of Systems     Objective:   Physical Exam Exam of the right scrotum does show some slight erythema and a oval patchy type appearance. Penis and testes normal. Skin in the inguinal areas also normal.       Assessment & Plan:  Dermatitis  recommend Cortizone cream twice per day for the next 10-14 days.

## 2014-02-03 ENCOUNTER — Ambulatory Visit (INDEPENDENT_AMBULATORY_CARE_PROVIDER_SITE_OTHER): Payer: 59 | Admitting: Family Medicine

## 2014-02-03 ENCOUNTER — Encounter: Payer: Self-pay | Admitting: Family Medicine

## 2014-02-03 VITALS — BP 118/70 | HR 102 | Wt 168.0 lb

## 2014-02-03 DIAGNOSIS — L259 Unspecified contact dermatitis, unspecified cause: Secondary | ICD-10-CM

## 2014-02-03 DIAGNOSIS — K130 Diseases of lips: Secondary | ICD-10-CM

## 2014-02-03 MED ORDER — PREDNISONE 10 MG PO KIT: PACK | ORAL | Status: DC

## 2014-02-03 MED ORDER — METHYLPREDNISOLONE SODIUM SUCC 125 MG IJ SOLR
125.0000 mg | Freq: Once | INTRAMUSCULAR | Status: AC
  Administered 2014-02-03: 125 mg via INTRAMUSCULAR

## 2014-02-03 MED ORDER — SODIUM CHLORIDE 0.9 % IV SOLN: 125.0000 mg | INTRAVENOUS | Status: DC

## 2014-02-03 NOTE — Progress Notes (Signed)
   Subjective:    Patient ID: Adrian Mcneil, male    DOB: 1966/06/16, 48 y.o.   MRN: [id number]  HPI He is here for evaluation of a rash this started over the weekend. He was doing yard work and noted reddish streaks with vesicles on his arms and legs. He also noted some swelling on the left side of the shaft of the penis. He also has a lesion on his lower right lip that he would like evaluated.   Review of Systems     Objective:   Physical Exam Alert and in no distress. A 1 cm round smooth lesion is noted in the lower lip on the right. Exam of his skin does show multiple erythematous linear vesicular lesions on his medial aspect of the legs and on his forearms. Exam of the penis does show slight erythema on the shaft of the penis on the left.       Assessment & Plan:  Contact dermatitis - Plan: methylPREDNISolone sodium succinate (SOLU-MEDROL) 125 mg/2 mL injection 125 mg, DISCONTINUED: methylPREDNISolone sodium succinate (SOLU-MEDROL) 130 mg in sodium chloride 0.9 % 50 mL IVPB  Lip lesion  I discussed various options with him concerning the contact dermatitis. He would like to get an injection and steroids. The lip lesion appears to be an inclusion cyst. An 11 blade was taken to unroofed the lesion. Clear gelatinous material was expressed. The base of the lesion was then excised with the 11 blade. He obtained good hemostasis with pressure. If he has a recurrence of this, I will refer him to oral surgery.

## 2014-02-12 ENCOUNTER — Other Ambulatory Visit: Payer: Self-pay | Admitting: Family Medicine

## 2014-02-12 MED ORDER — TRIAMCINOLONE ACETONIDE 0.5 % EX CREA: 1.0000 "application " | TOPICAL_CREAM | Freq: Three times a day (TID) | CUTANEOUS | Status: DC | PRN

## 2014-04-21 ENCOUNTER — Other Ambulatory Visit: Payer: Self-pay | Admitting: Dermatology

## 2014-07-02 ENCOUNTER — Other Ambulatory Visit: Payer: Self-pay | Admitting: Family Medicine

## 2014-07-02 NOTE — Telephone Encounter (Signed)
Is this okay to refill? 

## 2014-07-27 ENCOUNTER — Encounter: Payer: Self-pay | Admitting: Family Medicine

## 2014-07-27 ENCOUNTER — Ambulatory Visit (INDEPENDENT_AMBULATORY_CARE_PROVIDER_SITE_OTHER): Payer: 59 | Admitting: Family Medicine

## 2014-07-27 VITALS — BP 114/70 | HR 116 | Wt 168.0 lb

## 2014-07-27 DIAGNOSIS — M79621 Pain in right upper arm: Secondary | ICD-10-CM

## 2014-07-27 DIAGNOSIS — E785 Hyperlipidemia, unspecified: Secondary | ICD-10-CM

## 2014-07-27 DIAGNOSIS — Z209 Contact with and (suspected) exposure to unspecified communicable disease: Secondary | ICD-10-CM

## 2014-07-27 DIAGNOSIS — M25511 Pain in right shoulder: Secondary | ICD-10-CM

## 2014-07-27 DIAGNOSIS — Z23 Encounter for immunization: Secondary | ICD-10-CM

## 2014-07-27 DIAGNOSIS — K219 Gastro-esophageal reflux disease without esophagitis: Secondary | ICD-10-CM | POA: Insufficient documentation

## 2014-07-27 DIAGNOSIS — K5190 Ulcerative colitis, unspecified, without complications: Secondary | ICD-10-CM

## 2014-07-27 LAB — COMPREHENSIVE METABOLIC PANEL
ALBUMIN: 4.3 g/dL (ref 3.5–5.2)
ALT: 20 U/L (ref 0–53)
AST: 17 U/L (ref 0–37)
Alkaline Phosphatase: 94 U/L (ref 39–117)
BILIRUBIN TOTAL: 0.5 mg/dL (ref 0.2–1.2)
BUN: 16 mg/dL (ref 6–23)
CO2: 29 mEq/L (ref 19–32)
Calcium: 9.7 mg/dL (ref 8.4–10.5)
Chloride: 101 mEq/L (ref 96–112)
Creat: 1.11 mg/dL (ref 0.50–1.35)
GLUCOSE: 71 mg/dL (ref 70–99)
Potassium: 4.6 mEq/L (ref 3.5–5.3)
Sodium: 140 mEq/L (ref 135–145)
TOTAL PROTEIN: 7.9 g/dL (ref 6.0–8.3)

## 2014-07-27 LAB — LIPID PANEL
Cholesterol: 234 mg/dL — ABNORMAL HIGH (ref 0–200)
HDL: 40 mg/dL (ref >39)
LDL Cholesterol: 145 mg/dL — ABNORMAL HIGH (ref 0–99)
Total CHOL/HDL Ratio: 5.9 Ratio
Triglycerides: 247 mg/dL — ABNORMAL HIGH (ref <150)
VLDL: 49 mg/dL — AB (ref 0–40)

## 2014-07-27 NOTE — Progress Notes (Signed)
   Subjective:    Patient ID: Adrian Mcneil, male    DOB: 03-07-1966, 48 y.o.   MRN: [id number]  HPI He is here for evaluation of some slight tenderness in the right axilla. He noted to several days ago and thinks it might be more swollen on that side. Presently he is having no symptoms in that area. He also notes cough and slight indigestion eating certain foods. His mother also has reflux disease. He does use Nexium on occasion and finds good results with that. He does have a history of ulcerative colitis and is presently on medication for this. He has had no breakthrough abdominal symptoms. Review his record also indicates elevated lipids. He is sexually active and would like to have STD testing. He also would like to discuss PREP. He also recently got a cancer insurance plan. He has questions concerning proper testing.  Review of Systems     Objective:   Physical Exam Alert and in no distress. Exam of the right axilla shows no visual or palpable lesions. No adenopathy is noted.       Assessment & Plan:  Gastroesophageal reflux disease without esophagitis - Plan: CBC with Differential, Comprehensive metabolic panel  Contact with or exposure to communicable disease - Plan: HIV antibody, RPR  Need for prophylactic vaccination and inoculation against influenza - Plan: Flu Vaccine QUAD 36+ mos IM  Axillary pain, right  Ulcerative colitis, without complications - Plan: CBC with Differential, Comprehensive metabolic panel  Hyperlipidemia with target LDL less than 100 - Plan: Lipid panel  I reassured him that I found nothing of significance in the axilla. He is doing well on his also just colitis. I will do routine blood screening on him. Explained that at his age with no major risk factors, no further cancer evaluation is needed. Discussed colonoscopy and prostate cancer screening. Also there is no history of cancer in his family. Encouraged him to continue to take good care of  himself in regard to diet, exercise, I'll call consumption. He does not smoke. I also discussed PREP with him in regard to risks and benefits. Explained that I would indeed place him on at his request but proper follow-up is required including every 3 month follow-up visit for HIV and STD testing. Also explained that this is not 100% effective. He will let me know whether he wants to start this.

## 2014-07-28 LAB — RPR

## 2014-07-28 LAB — CBC WITH DIFFERENTIAL/PLATELET
BASOS PCT: 0 % (ref 0–1)
Basophils Absolute: 0 10*3/uL (ref 0.0–0.1)
Eosinophils Absolute: 0.1 10*3/uL (ref 0.0–0.7)
Eosinophils Relative: 1 % (ref 0–5)
HCT: 43.5 % (ref 39.0–52.0)
HEMOGLOBIN: 14.8 g/dL (ref 13.0–17.0)
Lymphocytes Relative: 21 % (ref 12–46)
Lymphs Abs: 1.8 10*3/uL (ref 0.7–4.0)
MCH: 30.1 pg (ref 26.0–34.0)
MCHC: 34 g/dL (ref 30.0–36.0)
MCV: 88.6 fL (ref 78.0–100.0)
MONOS PCT: 8 % (ref 3–12)
MPV: 9.4 fL (ref 9.4–12.4)
Monocytes Absolute: 0.7 10*3/uL (ref 0.1–1.0)
NEUTROS ABS: 6 10*3/uL (ref 1.7–7.7)
Neutrophils Relative %: 70 % (ref 43–77)
Platelets: 287 10*3/uL (ref 150–400)
RBC: 4.91 MIL/uL (ref 4.22–5.81)
RDW: 13.2 % (ref 11.5–15.5)
WBC: 8.5 10*3/uL (ref 4.0–10.5)

## 2014-07-28 LAB — HIV ANTIBODY (ROUTINE TESTING W REFLEX): HIV 1&2 Ab, 4th Generation: NONREACTIVE

## 2014-08-25 ENCOUNTER — Other Ambulatory Visit: Payer: Self-pay | Admitting: Family Medicine

## 2014-08-25 NOTE — Telephone Encounter (Signed)
Is this okay?

## 2014-12-03 ENCOUNTER — Encounter: Payer: Self-pay | Admitting: Family Medicine

## 2014-12-03 ENCOUNTER — Ambulatory Visit (INDEPENDENT_AMBULATORY_CARE_PROVIDER_SITE_OTHER): Payer: 59 | Admitting: Family Medicine

## 2014-12-03 VITALS — BP 122/80 | HR 80 | Wt 172.4 lb

## 2014-12-03 DIAGNOSIS — K130 Diseases of lips: Secondary | ICD-10-CM | POA: Diagnosis not present

## 2014-12-03 NOTE — Patient Instructions (Addendum)
Use A+D ointment.. cheilitis

## 2014-12-03 NOTE — Progress Notes (Signed)
   Subjective:    Patient ID: Adrian Mcneil, male    DOB: 03-06-1966, 49 y.o.   MRN: [id number]  HPI He complains of difficulty with slight discomfort and cracking of the corner of the lip on the left. He has tried some OTC meds without much success. He is having no difficulty with his underlying Ulcerative colitis.   Review of Systems     Objective:   Physical Exam Exam of the lips does show slight fissuring in the corner of the left lip. Nothing noted on the right. Oral mucosa is totally normal.       Assessment & Plan:  Cheilitis Recommended trying A+D ointment and if no improvement then switch to triple antibiotic.

## 2015-01-12 ENCOUNTER — Ambulatory Visit (INDEPENDENT_AMBULATORY_CARE_PROVIDER_SITE_OTHER): Payer: 59 | Admitting: Family Medicine

## 2015-01-12 ENCOUNTER — Encounter: Payer: Self-pay | Admitting: Family Medicine

## 2015-01-12 ENCOUNTER — Ambulatory Visit
Admission: RE | Admit: 2015-01-12 | Discharge: 2015-01-12 | Disposition: A | Discharge status: Home / Self Care | Payer: 59 | Source: Ambulatory Visit | Attending: Family Medicine | Admitting: Family Medicine

## 2015-01-12 VITALS — BP 116/70 | HR 104 | Wt 168.0 lb

## 2015-01-12 DIAGNOSIS — S83511A Sprain of anterior cruciate ligament of right knee, initial encounter: Secondary | ICD-10-CM | POA: Diagnosis not present

## 2015-01-12 NOTE — Progress Notes (Signed)
   Subjective:    Patient ID: Adrian Mcneil, male    DOB: 09-Jan-1966, 49 y.o.   MRN: [id number]  HPI He is here for consult concerning a six-month history of a popping sensation in his right knee. He notes that this occurs when he fully flexes his knee past 90. He has no pain, swelling with this. He does have some instability in his knee when he runs and therefore has limited his running. Have a previous history of anterior cruciate ligament tear however he did not have it repaired.   Review of Systems     Objective:   Physical Exam Right knee exam does show positive anterior drawer. No effusion is noted. No palpable tenderness. McMurray's testing negative. Medial and lateral collateral ligaments intact.       Assessment & Plan:  Anterior cruciate ligament complete tear, right, initial encounter - Plan: DG Knee Complete 4 Views Right I explained that the popping sensation easily be cartilaginous in nature. Explained that deep knee bending is really not anatomically needed. He notes no popping sensation when he flexes only to 90. We discussed possible surgical intervention to help stabilize his knee and prevent further potential damage. Next will be obtained and we will discuss further care at that point.

## 2015-01-20 ENCOUNTER — Other Ambulatory Visit: Payer: Self-pay | Admitting: Family Medicine

## 2015-01-20 ENCOUNTER — Telehealth: Payer: Self-pay | Admitting: Family Medicine

## 2015-01-20 DIAGNOSIS — S83511A Sprain of anterior cruciate ligament of right knee, initial encounter: Secondary | ICD-10-CM

## 2015-01-20 NOTE — Telephone Encounter (Signed)
Called Dr. Marcelino Scot t# [phone] & he doesn't do ACL repairs.  [facility] [phone] set up appt 02/08/15 pt to arrive @ 1:30 with Dr. Marlou Sa. Left message for pt

## 2015-01-20 NOTE — Telephone Encounter (Signed)
Faxed office notes & Pt informed of appt, he will call them and reschedule because he will be out of town

## 2015-02-24 ENCOUNTER — Other Ambulatory Visit: Payer: Self-pay | Admitting: Orthopedic Surgery

## 2015-02-24 DIAGNOSIS — M25561 Pain in right knee: Secondary | ICD-10-CM

## 2015-03-05 ENCOUNTER — Ambulatory Visit
Admission: RE | Admit: 2015-03-05 | Discharge: 2015-03-05 | Disposition: A | Discharge status: Home / Self Care | Payer: 59 | Source: Ambulatory Visit | Attending: Orthopedic Surgery | Admitting: Orthopedic Surgery

## 2015-03-05 DIAGNOSIS — M25561 Pain in right knee: Secondary | ICD-10-CM

## 2015-03-31 ENCOUNTER — Telehealth: Payer: Self-pay | Admitting: Family Medicine

## 2015-03-31 NOTE — Telephone Encounter (Signed)
Per [employer] pt requested copies OV, Dx & referral info to [facility] for [facility] emailed to him at [email or web address]

## 2015-04-19 ENCOUNTER — Ambulatory Visit (INDEPENDENT_AMBULATORY_CARE_PROVIDER_SITE_OTHER): Payer: 59 | Admitting: Family Medicine

## 2015-04-19 ENCOUNTER — Encounter: Payer: Self-pay | Admitting: Family Medicine

## 2015-04-19 VITALS — BP 120/70 | HR 118 | Temp 99.7°F | Wt 167.0 lb

## 2015-04-19 DIAGNOSIS — B001 Herpesviral vesicular dermatitis: Secondary | ICD-10-CM

## 2015-04-19 DIAGNOSIS — Z8619 Personal history of other infectious and parasitic diseases: Secondary | ICD-10-CM | POA: Insufficient documentation

## 2015-04-19 DIAGNOSIS — B349 Viral infection, unspecified: Secondary | ICD-10-CM

## 2015-04-19 DIAGNOSIS — R509 Fever, unspecified: Secondary | ICD-10-CM

## 2015-04-19 DIAGNOSIS — Z209 Contact with and (suspected) exposure to unspecified communicable disease: Secondary | ICD-10-CM | POA: Diagnosis not present

## 2015-04-19 LAB — POC INFLUENZA A&B (BINAX/QUICKVUE): INFLUENZA A, POC: NEGATIVE

## 2015-04-19 LAB — POCT RAPID STREP A (OFFICE): RAPID STREP A SCREEN: NEGATIVE

## 2015-04-19 NOTE — Progress Notes (Signed)
   Subjective:    Patient ID: Adrian Mcneil, male    DOB: 1965-08-18, 49 y.o.   MRN: [id number]  HPI He complains of a three-day history of fever, chills, myalgias and in the last day slight sore throat, PND and hoarse voice but no coughing. He does complain of a cold sore present on the lower left lip. He does have a previous history of difficulty with this.No earache, chest congestion.He also had possible STD exposure oxalate one week ago but no discharge or dysuria.   Review of Systems     Objective:   Physical Exam .Alert and in no distress. 0.5 cm ulcerated lesion on the left lower lip.Tympanic membranes and canals are normal. Pharyngeal area is Slightly red with questionable exudates on the tonsils. Neck is supple without adenopathy or thyromegaly. Cardiac exam shows a regular sinus rhythm without murmurs or gallops. Lungs are clear to auscultation.  Flu and strep screens negative      Assessment & Plan:  Other specified fever - Plan: POC Influenza A&B (Binax test), POCT rapid strep A  Viral syndrome  Contact with and suspected exposure to communicable disease - Plan: HIV antibody, RPR  Herpes labialis Recommend supportive care.Valtrex not given since he has had this for a few days.

## 2015-04-19 NOTE — Patient Instructions (Signed)
Take 2 Aleve twice per day for the aches and pains.

## 2015-04-20 LAB — RPR

## 2015-04-20 LAB — HIV ANTIBODY (ROUTINE TESTING W REFLEX): HIV 1&2 Ab, 4th Generation: NONREACTIVE

## 2015-04-21 MED ORDER — AZITHROMYCIN 500 MG PO TABS: 500.0000 mg | ORAL_TABLET | Freq: Every day | ORAL | Status: DC

## 2015-04-21 NOTE — Addendum Note (Signed)
Addended by: Denita Lung on: 04/21/2015 05:10 PM   Modules accepted: Orders

## 2015-04-21 NOTE — Progress Notes (Signed)
   Subjective:    Patient ID: Adrian Mcneil, male    DOB: Mar 04, 1966, 49 y.o.   MRN: [id number]  HPI    Review of Systems     Objective:   Physical Exam        Assessment & Plan:  He is still having difficulty with cough and congestion. I will call in azithromycin. If he continues to have trouble, he should call me.

## 2015-04-22 ENCOUNTER — Ambulatory Visit (INDEPENDENT_AMBULATORY_CARE_PROVIDER_SITE_OTHER): Payer: 59 | Admitting: Family Medicine

## 2015-04-22 DIAGNOSIS — B009 Herpesviral infection, unspecified: Secondary | ICD-10-CM

## 2015-04-22 DIAGNOSIS — K0500 Acute gingivitis, plaque induced: Secondary | ICD-10-CM | POA: Diagnosis not present

## 2015-04-22 MED ORDER — VALACYCLOVIR HCL 1 G PO TABS: ORAL_TABLET | ORAL | Status: DC

## 2015-04-22 NOTE — Progress Notes (Signed)
   Subjective:    Patient ID: Adrian Mcneil, male    DOB: [birth year], 49 y.o.   MRN: [id number]  HPI He is here for for recheck. He is still having some cough and congestion but apparently this is better. He now has noted left lower gum irritation and bleeding. He also has that same sensation on the right side but no bleeding is noted. After the initial gingivitis diagnosis was made he then returned stating he saw some lesions in the back of the throat that he did not note before.  Review of Systems     Objective:   Physical Exam The left lower gingiva is slightly erythematous in the incisor area. Neck is supple without adenopathy, tender submandibular area but noted no dimension. As have 2 erythematous lesions on the soft palate, one is ulcerated.      Assessment & Plan:  Gingivitis, acute  HSV-1 (herpes simplex virus 1) infection - Plan: valACYclovir (VALTREX) 1000 MG tablet Looks as if he has a combination of gingivitis as well as HSV. I will treat him with Valtrex and given refills. Again recommend using half water and peroxide for the gingivitis and call his dentist if continued difficulty.

## 2015-04-22 NOTE — Patient Instructions (Signed)
Gargle With half water and peroxide then spit it out. If continued difficulty see a dentist.

## 2015-06-14 ENCOUNTER — Other Ambulatory Visit: Payer: Self-pay | Admitting: Family Medicine

## 2015-06-14 NOTE — Telephone Encounter (Signed)
IS THIS OKAY

## 2015-06-21 ENCOUNTER — Other Ambulatory Visit: Payer: Self-pay | Admitting: Family Medicine

## 2015-06-22 NOTE — Telephone Encounter (Signed)
Is this okay to refill? 

## 2015-07-14 ENCOUNTER — Other Ambulatory Visit (HOSPITAL_COMMUNITY): Payer: Self-pay | Admitting: Orthopedic Surgery

## 2015-08-12 ENCOUNTER — Encounter (HOSPITAL_COMMUNITY): Payer: Self-pay

## 2015-08-12 ENCOUNTER — Encounter (HOSPITAL_COMMUNITY)
Admission: RE | Admit: 2015-08-12 | Discharge: 2015-08-12 | Disposition: A | Discharge status: Home / Self Care | Payer: 59 | Source: Ambulatory Visit | Attending: Orthopedic Surgery | Admitting: Orthopedic Surgery

## 2015-08-12 DIAGNOSIS — Z01812 Encounter for preprocedural laboratory examination: Secondary | ICD-10-CM | POA: Diagnosis not present

## 2015-08-12 DIAGNOSIS — S83241A Other tear of medial meniscus, current injury, right knee, initial encounter: Secondary | ICD-10-CM | POA: Insufficient documentation

## 2015-08-12 DIAGNOSIS — X58XXXA Exposure to other specified factors, initial encounter: Secondary | ICD-10-CM | POA: Insufficient documentation

## 2015-08-12 DIAGNOSIS — S83511A Sprain of anterior cruciate ligament of right knee, initial encounter: Secondary | ICD-10-CM | POA: Diagnosis not present

## 2015-08-12 HISTORY — DX: Functional dyspepsia: K30

## 2015-08-12 LAB — CBC
HEMATOCRIT: 41.8 % (ref 39.0–52.0)
Hemoglobin: 14.2 g/dL (ref 13.0–17.0)
MCH: 30.2 pg (ref 26.0–34.0)
MCHC: 34 g/dL (ref 30.0–36.0)
MCV: 88.9 fL (ref 78.0–100.0)
Platelets: 298 10*3/uL (ref 150–400)
RBC: 4.7 MIL/uL (ref 4.22–5.81)
RDW: 12.7 % (ref 11.5–15.5)
WBC: 8 10*3/uL (ref 4.0–10.5)

## 2015-08-12 NOTE — Progress Notes (Signed)
PCP - Dr. Jill Alexanders Cardiologist - denies  EKG/CXR - denies  Echo- denies Stress test- 10-15 years ago Cardiac cath - denies  Patient denies chest pain and shortness of breath at PAT appointment.    Patient refused to sign consent at PAT appointment because he still has questions about the procedure.  Patient has attempted to call Dr. Randel Pigg office and is waiting for a return call.

## 2015-08-12 NOTE — Pre-Procedure Instructions (Signed)
    Jerimiah Wolman Skorupski  08/12/2015      [id number] [address] [address]Phone: [phone]: [phone] [facility] [place]Phone: [phone]: [phone]   Your procedure is scheduled on Thursday, January 19th, 2017.  [facility]  Call this number if you have problems the morning of surgery:  [phone]   Remember:  Do not eat food or drink liquids after midnight.   Take these medicines the morning of surgery with A SIP OF WATER: Mesalamine (Asacol).   Stop taking: Aspirin, NSAIDS, Aleve, Naproxen, Ibuprofen, Advil, Motrin, BC's, Goody's, Fish oil, all herbal medications, and all vitamins.    Do not wear jewelry.  Do not wear lotions, powders, or colognes.  You may wear deodorant.  Men may shave face and neck.  Do not bring valuables to the hospital.  [facility] belongings or [facility].  Contacts, dentures or bridgework may not be worn into surgery.  Leave your suitcase in the car.  After surgery it may be brought to your room.  For patients admitted to the hospital, discharge time will be determined by your treatment team.  Patients discharged the day of surgery will not be allowed to drive home.   Special instructions:  See attached.   Please read over the following fact sheets that you were given. Pain Booklet, Coughing and Deep Breathing and Surgical Site Infection Prevention

## 2015-08-19 ENCOUNTER — Ambulatory Visit (HOSPITAL_COMMUNITY): Payer: 59 | Admitting: Certified Registered"

## 2015-08-19 ENCOUNTER — Encounter (HOSPITAL_COMMUNITY): Payer: Self-pay | Admitting: Surgery

## 2015-08-19 ENCOUNTER — Ambulatory Visit (HOSPITAL_COMMUNITY)
Admission: RE | Admit: 2015-08-19 | Discharge: 2015-08-19 | Disposition: A | Discharge status: Home / Self Care | Payer: 59 | Source: Ambulatory Visit | Attending: Orthopedic Surgery | Admitting: Orthopedic Surgery

## 2015-08-19 ENCOUNTER — Encounter (HOSPITAL_COMMUNITY)
Admission: RE | Disposition: A | Discharge status: Home / Self Care | Payer: Self-pay | Source: Ambulatory Visit | Attending: Orthopedic Surgery

## 2015-08-19 DIAGNOSIS — S83511A Sprain of anterior cruciate ligament of right knee, initial encounter: Secondary | ICD-10-CM | POA: Insufficient documentation

## 2015-08-19 DIAGNOSIS — X58XXXA Exposure to other specified factors, initial encounter: Secondary | ICD-10-CM | POA: Diagnosis not present

## 2015-08-19 DIAGNOSIS — E785 Hyperlipidemia, unspecified: Secondary | ICD-10-CM | POA: Insufficient documentation

## 2015-08-19 DIAGNOSIS — K5190 Ulcerative colitis, unspecified, without complications: Secondary | ICD-10-CM | POA: Insufficient documentation

## 2015-08-19 DIAGNOSIS — S83241A Other tear of medial meniscus, current injury, right knee, initial encounter: Secondary | ICD-10-CM | POA: Diagnosis not present

## 2015-08-19 HISTORY — PX: ANTERIOR CRUCIATE LIGAMENT REPAIR: SHX115

## 2015-08-19 SURGERY — RECONSTRUCTION, KNEE, ACL, USING HAMSTRING GRAFT
Anesthesia: Regional | Site: Knee | Laterality: Right

## 2015-08-19 MED ORDER — DEXAMETHASONE SODIUM PHOSPHATE 4 MG/ML IJ SOLN
INTRAMUSCULAR | Status: AC
  Filled 2015-08-19: qty 1

## 2015-08-19 MED ORDER — SUGAMMADEX SODIUM 200 MG/2ML IV SOLN
INTRAVENOUS | Status: AC
  Filled 2015-08-19: qty 2

## 2015-08-19 MED ORDER — ROCURONIUM BROMIDE 50 MG/5ML IV SOLN
INTRAVENOUS | Status: AC
  Filled 2015-08-19: qty 1

## 2015-08-19 MED ORDER — BUPIVACAINE HCL (PF) 0.25 % IJ SOLN
INTRAMUSCULAR | Status: DC | PRN
  Administered 2015-08-19: 10 mL

## 2015-08-19 MED ORDER — METHOCARBAMOL 500 MG PO TABS: 500.0000 mg | ORAL_TABLET | Freq: Four times a day (QID) | ORAL | Status: DC

## 2015-08-19 MED ORDER — MORPHINE SULFATE (PF) 4 MG/ML IV SOLN
INTRAVENOUS | Status: DC | PRN
  Administered 2015-08-19: 8 mg via INTRAVENOUS

## 2015-08-19 MED ORDER — PHENYLEPHRINE HCL 10 MG/ML IJ SOLN
INTRAMUSCULAR | Status: DC | PRN
  Administered 2015-08-19: 40 ug via INTRAVENOUS
  Administered 2015-08-19: 80 ug via INTRAVENOUS

## 2015-08-19 MED ORDER — FENTANYL CITRATE (PF) 100 MCG/2ML IJ SOLN
INTRAMUSCULAR | Status: AC
  Administered 2015-08-19: 100 ug
  Filled 2015-08-19: qty 2

## 2015-08-19 MED ORDER — PHENYLEPHRINE 40 MCG/ML (10ML) SYRINGE FOR IV PUSH (FOR BLOOD PRESSURE SUPPORT)
PREFILLED_SYRINGE | INTRAVENOUS | Status: AC
  Filled 2015-08-19: qty 10

## 2015-08-19 MED ORDER — BUPIVACAINE-EPINEPHRINE (PF) 0.5% -1:200000 IJ SOLN
INTRAMUSCULAR | Status: DC | PRN
  Administered 2015-08-19: 30 mL via PERINEURAL

## 2015-08-19 MED ORDER — MIDAZOLAM HCL 5 MG/5ML IJ SOLN
INTRAMUSCULAR | Status: DC | PRN
  Administered 2015-08-19: 2 mg via INTRAVENOUS

## 2015-08-19 MED ORDER — OXYCODONE-ACETAMINOPHEN 5-325 MG PO TABS: 1.0000 | ORAL_TABLET | Freq: Four times a day (QID) | ORAL | Status: DC | PRN

## 2015-08-19 MED ORDER — GLYCOPYRROLATE 0.2 MG/ML IJ SOLN
INTRAMUSCULAR | Status: AC
  Filled 2015-08-19: qty 1

## 2015-08-19 MED ORDER — PROPOFOL 10 MG/ML IV BOLUS
INTRAVENOUS | Status: DC | PRN
  Administered 2015-08-19: 150 mg via INTRAVENOUS

## 2015-08-19 MED ORDER — MORPHINE SULFATE (PF) 4 MG/ML IV SOLN
INTRAVENOUS | Status: AC
  Filled 2015-08-19: qty 1

## 2015-08-19 MED ORDER — BUPIVACAINE HCL (PF) 0.25 % IJ SOLN
INTRAMUSCULAR | Status: AC
  Filled 2015-08-19: qty 30

## 2015-08-19 MED ORDER — GLYCOPYRROLATE 0.2 MG/ML IJ SOLN
INTRAMUSCULAR | Status: AC
  Filled 2015-08-19: qty 3

## 2015-08-19 MED ORDER — EPHEDRINE SULFATE 50 MG/ML IJ SOLN
INTRAMUSCULAR | Status: DC | PRN
  Administered 2015-08-19: 5 mg via INTRAVENOUS

## 2015-08-19 MED ORDER — HYDROMORPHONE HCL 1 MG/ML IJ SOLN: 0.2500 mg | INTRAMUSCULAR | Status: DC | PRN

## 2015-08-19 MED ORDER — FENTANYL CITRATE (PF) 250 MCG/5ML IJ SOLN
INTRAMUSCULAR | Status: AC
  Filled 2015-08-19: qty 5

## 2015-08-19 MED ORDER — MIDAZOLAM HCL 2 MG/2ML IJ SOLN
INTRAMUSCULAR | Status: AC
  Administered 2015-08-19: 2 mg
  Filled 2015-08-19: qty 2

## 2015-08-19 MED ORDER — PROPOFOL 10 MG/ML IV BOLUS
INTRAVENOUS | Status: AC
  Filled 2015-08-19: qty 20

## 2015-08-19 MED ORDER — CLONIDINE HCL (ANALGESIA) 100 MCG/ML EP SOLN
EPIDURAL | Status: DC | PRN
  Administered 2015-08-19: 1 mL

## 2015-08-19 MED ORDER — MIDAZOLAM HCL 2 MG/2ML IJ SOLN
INTRAMUSCULAR | Status: AC
  Filled 2015-08-19: qty 2

## 2015-08-19 MED ORDER — ROCURONIUM BROMIDE 100 MG/10ML IV SOLN
INTRAVENOUS | Status: DC | PRN
  Administered 2015-08-19: 10 mg via INTRAVENOUS
  Administered 2015-08-19: 15 mg via INTRAVENOUS
  Administered 2015-08-19: 10 mg via INTRAVENOUS
  Administered 2015-08-19: 50 mg via INTRAVENOUS

## 2015-08-19 MED ORDER — CHLORHEXIDINE GLUCONATE 4 % EX LIQD: 60.0000 mL | Freq: Once | CUTANEOUS | Status: DC

## 2015-08-19 MED ORDER — 0.9 % SODIUM CHLORIDE (POUR BTL) OPTIME
TOPICAL | Status: DC | PRN
  Administered 2015-08-19: 1000 mL

## 2015-08-19 MED ORDER — LACTATED RINGERS IV SOLN
INTRAVENOUS | Status: DC | PRN
  Administered 2015-08-19: 12:00:00 via INTRAVENOUS

## 2015-08-19 MED ORDER — LACTATED RINGERS IV SOLN
INTRAVENOUS | Status: DC
  Administered 2015-08-19: 10:00:00 via INTRAVENOUS

## 2015-08-19 MED ORDER — FENTANYL CITRATE (PF) 100 MCG/2ML IJ SOLN
INTRAMUSCULAR | Status: DC | PRN
  Administered 2015-08-19: 125 ug via INTRAVENOUS

## 2015-08-19 MED ORDER — SUGAMMADEX SODIUM 200 MG/2ML IV SOLN
INTRAVENOUS | Status: DC | PRN
  Administered 2015-08-19: 200 mg via INTRAVENOUS

## 2015-08-19 MED ORDER — SODIUM CHLORIDE 0.9 % IR SOLN
Status: DC | PRN
  Administered 2015-08-19 (×6): 3000 mL

## 2015-08-19 MED ORDER — CLONIDINE HCL (ANALGESIA) 100 MCG/ML EP SOLN
150.0000 ug | Freq: Once | EPIDURAL | Status: DC
  Filled 2015-08-19: qty 1.5

## 2015-08-19 MED ORDER — STERILE WATER FOR INJECTION IJ SOLN
INTRAMUSCULAR | Status: AC
  Filled 2015-08-19: qty 10

## 2015-08-19 MED ORDER — ASPIRIN EC 325 MG PO TBEC: 325.0000 mg | DELAYED_RELEASE_TABLET | Freq: Every day | ORAL | Status: DC

## 2015-08-19 MED ORDER — EPHEDRINE SULFATE 50 MG/ML IJ SOLN
INTRAMUSCULAR | Status: AC
  Filled 2015-08-19: qty 1

## 2015-08-19 MED ORDER — NEOSTIGMINE METHYLSULFATE 10 MG/10ML IV SOLN
INTRAVENOUS | Status: AC
  Filled 2015-08-19: qty 1

## 2015-08-19 MED ORDER — LIDOCAINE HCL (CARDIAC) 20 MG/ML IV SOLN
INTRAVENOUS | Status: DC | PRN
  Administered 2015-08-19: 50 mg via INTRAVENOUS

## 2015-08-19 MED ORDER — DIPHENHYDRAMINE HCL 50 MG/ML IJ SOLN
INTRAMUSCULAR | Status: AC
  Filled 2015-08-19: qty 1

## 2015-08-19 MED ORDER — DIPHENHYDRAMINE HCL 50 MG/ML IJ SOLN
INTRAMUSCULAR | Status: DC | PRN
  Administered 2015-08-19: 12.5 mg via INTRAVENOUS

## 2015-08-19 MED ORDER — CEFAZOLIN SODIUM-DEXTROSE 2-3 GM-% IV SOLR: 2.0000 g | INTRAVENOUS | Status: DC

## 2015-08-19 MED ORDER — CEFAZOLIN SODIUM-DEXTROSE 2-3 GM-% IV SOLR
INTRAVENOUS | Status: AC
  Administered 2015-08-19: 2 g via INTRAVENOUS
  Filled 2015-08-19: qty 50

## 2015-08-19 MED ORDER — DEXAMETHASONE SODIUM PHOSPHATE 4 MG/ML IJ SOLN
INTRAMUSCULAR | Status: DC | PRN
  Administered 2015-08-19: 4 mg via INTRAVENOUS

## 2015-08-19 SURGICAL SUPPLY — 78 items
ANCHOR BUTTON TIGHTROPE ACL RT (Orthopedic Implant) ×2 IMPLANT
ANCHOR BUTTON TIGHTROPE RN 14 (Anchor) ×2 IMPLANT
BANDAGE ELASTIC 6 VELCRO ST LF (GAUZE/BANDAGES/DRESSINGS) ×2 IMPLANT
BANDAGE ESMARK 6X9 LF (GAUZE/BANDAGES/DRESSINGS) IMPLANT
BLADE CUDA 5.5 (BLADE) IMPLANT
BLADE CUTTER GATOR 3.5 (BLADE) ×2 IMPLANT
BLADE GREAT WHITE 4.2 (BLADE) ×2 IMPLANT
BLADE SURG 10 STRL SS (BLADE) ×2 IMPLANT
BLADE SURG 15 STRL LF DISP TIS (BLADE) ×2 IMPLANT
BLADE SURG 15 STRL SS (BLADE) ×2
BNDG ELASTIC 6X15 VLCR STRL LF (GAUZE/BANDAGES/DRESSINGS) ×2 IMPLANT
BNDG ESMARK 6X9 LF (GAUZE/BANDAGES/DRESSINGS)
BONE MATRIX DEMINERALIZED 1CC (Bone Implant) ×4 IMPLANT
BUR OVAL 6.0 (BURR) ×2 IMPLANT
COVER MAYO STAND STRL (DRAPES) IMPLANT
COVER SURGICAL LIGHT HANDLE (MISCELLANEOUS) ×2 IMPLANT
CUFF TOURNIQUET SINGLE 34IN LL (TOURNIQUET CUFF) IMPLANT
DECANTER SPIKE VIAL GLASS SM (MISCELLANEOUS) ×2 IMPLANT
DRAPE ARTHROSCOPY W/POUCH 114 (DRAPES) ×2 IMPLANT
DRAPE INCISE IOBAN 66X45 STRL (DRAPES) ×2 IMPLANT
DRAPE U-SHAPE 47X51 STRL (DRAPES) ×2 IMPLANT
DRILL FLIPCUTTER II 9.0MM (INSTRUMENTS) ×1 IMPLANT
DRSG ADAPTIC 3X8 NADH LF (GAUZE/BANDAGES/DRESSINGS) ×2 IMPLANT
DRSG MEPILEX BORDER 4X4 (GAUZE/BANDAGES/DRESSINGS) ×6 IMPLANT
DRSG PAD ABDOMINAL 8X10 ST (GAUZE/BANDAGES/DRESSINGS) ×4 IMPLANT
DURAPREP 26ML APPLICATOR (WOUND CARE) ×4 IMPLANT
ELECT REM PT RETURN 9FT ADLT (ELECTROSURGICAL) ×2
ELECTRODE REM PT RTRN 9FT ADLT (ELECTROSURGICAL) ×1 IMPLANT
FLIPCUTTER II 9.0MM (INSTRUMENTS) ×2
GAUZE SPONGE 4X4 12PLY STRL (GAUZE/BANDAGES/DRESSINGS) ×2 IMPLANT
GAUZE XEROFORM 1X8 LF (GAUZE/BANDAGES/DRESSINGS) ×4 IMPLANT
GLOVE BIO SURGEON STRL SZ 6 (GLOVE) ×2 IMPLANT
GLOVE BIO SURGEON STRL SZ7 (GLOVE) ×4 IMPLANT
GLOVE BIOGEL PI IND STRL 8 (GLOVE) ×2 IMPLANT
GLOVE BIOGEL PI INDICATOR 8 (GLOVE) ×2
GLOVE ORTHO TXT STRL SZ7.5 (GLOVE) ×2 IMPLANT
GLOVE SURG ORTHO 8.0 STRL STRW (GLOVE) ×2 IMPLANT
GLOVE SURG SS PI 6.0 STRL IVOR (GLOVE) ×2 IMPLANT
GLOVE SURG SS PI 6.5 STRL IVOR (GLOVE) ×4 IMPLANT
GLOVE SURG SS PI 7.0 STRL IVOR (GLOVE) ×2 IMPLANT
GOWN SPEC L3 XXLG W/TWL (GOWN DISPOSABLE) ×2 IMPLANT
GOWN STRL REUS W/ TWL LRG LVL3 (GOWN DISPOSABLE) ×4 IMPLANT
GOWN STRL REUS W/TWL LRG LVL3 (GOWN DISPOSABLE) ×4
IMMOBILIZER KNEE 22 UNIV (SOFTGOODS) ×2 IMPLANT
KIT BASIN OR (CUSTOM PROCEDURE TRAY) ×2 IMPLANT
KIT BIOCARTILAGE DEL W/SYRINGE (KITS) ×2 IMPLANT
KIT ROOM TURNOVER OR (KITS) ×2 IMPLANT
MANIFOLD NEPTUNE II (INSTRUMENTS) ×2 IMPLANT
NEEDLE 18GX1X1/2 (RX/OR ONLY) (NEEDLE) ×2 IMPLANT
NS IRRIG 1000ML POUR BTL (IV SOLUTION) ×12 IMPLANT
PACK ARTHROSCOPY DSU (CUSTOM PROCEDURE TRAY) ×2 IMPLANT
PAD ARMBOARD 7.5X6 YLW CONV (MISCELLANEOUS) ×4 IMPLANT
PAD CAST 4YDX4 CTTN HI CHSV (CAST SUPPLIES) ×1 IMPLANT
PADDING CAST COTTON 4X4 STRL (CAST SUPPLIES) ×1
PADDING CAST COTTON 6X4 STRL (CAST SUPPLIES) ×4 IMPLANT
PENCIL BUTTON HOLSTER BLD 10FT (ELECTRODE) ×2 IMPLANT
PK GRAFTLINK AUTO IMPLANT SYST (Anchor) ×2 IMPLANT
SET ARTHROSCOPY TUBING (MISCELLANEOUS) ×1
SET ARTHROSCOPY TUBING LN (MISCELLANEOUS) ×1 IMPLANT
SPONGE LAP 4X18 X RAY DECT (DISPOSABLE) ×4 IMPLANT
SPONGE SCRUB IODOPHOR (GAUZE/BANDAGES/DRESSINGS) ×2 IMPLANT
STRIP CLOSURE SKIN 1/2X4 (GAUZE/BANDAGES/DRESSINGS) ×2 IMPLANT
SUCTION FRAZIER TIP 10 FR DISP (SUCTIONS) ×2 IMPLANT
SUT ETHILON 3 0 PS 1 (SUTURE) ×4 IMPLANT
SUT MNCRL AB 3-0 PS2 18 (SUTURE) ×2 IMPLANT
SUT VIC AB 0 CT1 27 (SUTURE) ×1
SUT VIC AB 0 CT1 27XBRD ANBCTR (SUTURE) ×1 IMPLANT
SUT VIC AB 2-0 CT1 27 (SUTURE) ×1
SUT VIC AB 2-0 CT1 TAPERPNT 27 (SUTURE) ×1 IMPLANT
SUT VICRYL 0 UR6 27IN ABS (SUTURE) ×2 IMPLANT
SYR 30ML LL (SYRINGE) ×2 IMPLANT
SYR BULB IRRIGATION 50ML (SYRINGE) ×2 IMPLANT
SYR TB 1ML LUER SLIP (SYRINGE) ×2 IMPLANT
SYSTEM GRAFT IMPLANT AUTOGRAFT (Anchor) ×1 IMPLANT
TOWEL OR 17X24 6PK STRL BLUE (TOWEL DISPOSABLE) ×4 IMPLANT
TOWEL OR 17X26 10 PK STRL BLUE (TOWEL DISPOSABLE) ×2 IMPLANT
UNDERPAD 30X30 INCONTINENT (UNDERPADS AND DIAPERS) ×2 IMPLANT
WRAP KNEE MAXI GEL POST OP (GAUZE/BANDAGES/DRESSINGS) ×2 IMPLANT

## 2015-08-19 NOTE — Transfer of Care (Signed)
Immediate Anesthesia Transfer of Care Note  Patient: Adrian Mcneil  Procedure(s) Performed: Procedure(s): RECONSTRUCTION ANTERIOR CRUCIATE LIGAMENT (ACL) WITH HAMSTRING GRAFT, PARTIAL MEDIAL MENISECTOMY.   (Right)  Patient Location: PACU  Anesthesia Type:GA combined with regional for post-op pain  Level of Consciousness: awake, alert , oriented and unresponsive  Airway & Oxygen Therapy: Patient Spontanous Breathing and Patient connected to nasal cannula oxygen  Post-op Assessment: Report given to RN, Post -op Vital signs reviewed and stable and Patient moving all extremities X 4  Post vital signs: Reviewed and stable  Last Vitals:  Filed Vitals:   08/19/15 1030 08/19/15 1035  BP: 113/76 117/60  Pulse: 96 96  Temp:    Resp: 12 20    Complications: No apparent anesthesia complications

## 2015-08-19 NOTE — Brief Op Note (Signed)
08/19/2015  2:40 PM  PATIENT:  Adrian Mcneil  50 y.o. male  PRE-OPERATIVE DIAGNOSIS:  RIGHT KNEE ANTERIOR CRUCIATE LIGAMENT TEAR, MEDIAL MENISCAL TEAR  POST-OPERATIVE DIAGNOSIS:  RIGHT KNEE ANTERIOR CRUCIATE LIGAMENT TEAR,   PROCEDURE:  Procedure(s): RECONSTRUCTION ANTERIOR CRUCIATE LIGAMENT (ACL) WITH HAMSTRING GRAFT, PARTIAL MEDIAL MENISECTOMY.    SURGEON:  Surgeon(s): Meredith Pel, MD  ASSISTANT: April Green rnfa  ANESTHESIA:   general  EBL: 15 ml    Total I/O In: 1600 [I.V.:1600] Out: -   BLOOD ADMINISTERED: none  DRAINS: none   LOCAL MEDICATIONS USED:  none  SPECIMEN:  No Specimen  COUNTS:  YES  TOURNIQUET:   Total Tourniquet Time Documented: Thigh (Right) - 96 minutes Total: Thigh (Right) - 96 minutes   DICTATION: .Other Dictation: Dictation Number [id number]  PLAN OF CARE: Discharge to home after PACU  PATIENT DISPOSITION:  PACU - hemodynamically stable

## 2015-08-19 NOTE — Anesthesia Preprocedure Evaluation (Addendum)
Anesthesia Evaluation  Patient identified by MRN, date of birth, ID band Patient awake    Reviewed: Allergy & Precautions, H&P , NPO status , Patient's Chart, lab work & pertinent test results  Airway Mallampati: I  TM Distance: >3 FB Neck ROM: Full    Dental no notable dental hx. (+) Teeth Intact, Dental Advisory Given   Pulmonary neg pulmonary ROS,    Pulmonary exam normal breath sounds clear to auscultation       Cardiovascular negative cardio ROS   Rhythm:Regular Rate:Normal     Neuro/Psych negative neurological ROS  negative psych ROS   GI/Hepatic Neg liver ROS, PUD, GERD  Medicated and Controlled,  Endo/Other  negative endocrine ROS  Renal/GU negative Renal ROS  negative genitourinary   Musculoskeletal   Abdominal   Peds  Hematology negative hematology ROS (+)   Anesthesia Other Findings   Reproductive/Obstetrics negative OB ROS                            Anesthesia Physical Anesthesia Plan  ASA: II  Anesthesia Plan: General and Regional   Post-op Pain Management: GA combined w/ Regional for post-op pain   Induction: Intravenous  Airway Management Planned: Oral ETT  Additional Equipment:   Intra-op Plan:   Post-operative Plan: Extubation in OR  Informed Consent: I have reviewed the patients History and Physical, chart, labs and discussed the procedure including the risks, benefits and alternatives for the proposed anesthesia with the patient or authorized representative who has indicated his/her understanding and acceptance.   Dental advisory given  Plan Discussed with: CRNA  Anesthesia Plan Comments:         Anesthesia Quick Evaluation

## 2015-08-19 NOTE — Anesthesia Postprocedure Evaluation (Signed)
Anesthesia Post Note  Patient: Adrian Mcneil  Procedure(s) Performed: Procedure(s) (LRB): RECONSTRUCTION ANTERIOR CRUCIATE LIGAMENT (ACL) WITH HAMSTRING GRAFT, PARTIAL MEDIAL MENISECTOMY.   (Right)  Patient location during evaluation: PACU Anesthesia Type: General and Regional Level of consciousness: awake and alert, patient cooperative and oriented Pain management: pain level controlled Vital Signs Assessment: post-procedure vital signs reviewed and stable Respiratory status: spontaneous breathing, nonlabored ventilation and respiratory function stable Cardiovascular status: blood pressure returned to baseline and stable Postop Assessment: no signs of nausea or vomiting Anesthetic complications: no    Last Vitals:  Filed Vitals:   08/19/15 1515 08/19/15 1527  BP: 108/69 108/73  Pulse: 80 72  Temp:  36.7 C  Resp: 16 9    Last Pain:  Filed Vitals:   08/19/15 1530  PainSc: 0-No pain                 Oliviana Mcgahee,E. Eddith Mentor

## 2015-08-19 NOTE — Progress Notes (Signed)
Orthopedic Tech Progress Note Patient Details:  HASKEL DEWALT 1965/08/25 [id number]  Ortho Devices Type of Ortho Device: Crutches Ortho Device/Splint Interventions: Ordered, Adjustment   Braulio Bosch 08/19/2015, 3:46 PM

## 2015-08-19 NOTE — H&P (Signed)
Adrian Mcneil is an 50 y.o. male.   Chief Complaint: Right knee pain and instability HPI: Adrian Mcneil is a 50 year old patient with right knee pain and instability of long duration. Had an injury years ago and is reported pain and instability since that time recently developed a medial meniscal tear by MRI scan which has increased his pain and instability. He presents now for operative management after explanation of risk and benefits. There is no family history of DVT or pulmonary embolism  Past Medical History  Diagnosis Date  . Ulcerative colitis     Dr. Oletta Lamas  . Dyslipidemia   . Allergic rhinitis, cause unspecified     seasonal  . Erectile dysfunction   . FHx: cardiovascular disease   . Hyperlipidemia   . Cancer ([place])     skin  . Acid indigestion     Past Surgical History  Procedure Laterality Date  . Colonoscopy  2007    Dr. Oletta Lamas    Family History  Problem Relation Age of Onset  . Hyperlipidemia Mother   . Heart disease Father 2    MI  . Cancer Sister     skin   Social History:  reports that he has never smoked. He has never used smokeless tobacco. He reports that he drinks alcohol. He reports that he does not use illicit drugs.  Allergies: No Known Allergies  Medications Prior to Admission  Medication Sig Dispense Refill  . CIALIS 20 MG tablet TAKE 1 TABLET (20 MG TOTAL) BY MOUTH DAILY AS NEEDED FOR ERECTILE DYSFUNCTION. 5 tablet 6  . co-enzyme Q-10 30 MG capsule Take 30 mg by mouth daily.    . mesalamine (ASACOL) 400 MG EC tablet Take 800 mg by mouth daily.    Marland Kitchen omega-3 acid ethyl esters (LOVAZA) 1 g capsule Take 1 g by mouth daily.    . Red Yeast Rice Extract (RED YEAST RICE PO) Take 2 capsules by mouth daily.    . ASACOL 400 MG EC tablet TAKE 2 TABLET BY MOUTH TWICE A DAY CALL FOR APPOINTMENT!! (Patient not taking: Reported on 08/10/2015) 120 tablet 0  . azithromycin (ZITHROMAX) 500 MG tablet Take 1 tablet (500 mg total) by mouth daily. (Patient not  taking: Reported on 08/10/2015) 3 tablet 0  . clindamycin (CLINDAGEL) 1 % gel APPLY DAILY AS DIRECTED (Patient not taking: Reported on 08/10/2015) 60 g 11  . valACYclovir (VALTREX) 1000 MG tablet TAKE 2 TABLETS TWICE PER DAYS (Patient not taking: Reported on 08/10/2015) 4 tablet 2    No results found for this or any previous visit (from the past 48 hour(s)). No results found.  Review of Systems  Constitutional: Negative.   HENT: Negative.   Eyes: Negative.   Cardiovascular: Negative.   Gastrointestinal: Negative.   Genitourinary: Negative.   Musculoskeletal: Positive for joint pain.  Skin: Negative.   Neurological: Negative.   Endo/Heme/Allergies: Negative.   Psychiatric/Behavioral: Negative.     Blood pressure 117/60, pulse 96, temperature 98.2 F (36.8 C), temperature source Oral, resp. rate 20, height 5' 10"  (1.778 m), weight 73.936 kg (163 lb), SpO2 98 %. Physical Exam  Constitutional: He appears well-developed.  HENT:  Head: Normocephalic.  Eyes: Pupils are equal, round, and reactive to light.  Neck: Normal range of motion.  Cardiovascular: Normal rate.   Respiratory: Effort normal.  Neurological: He is alert.  Skin: Skin is warm.  Psychiatric: He has a normal mood and affect.  examination the right knee demonstrates intact skin hyperextension but  7 to full flexion collaterals are stable no posterior lateral rotatory stability is noted anterior cruciate ligament is out medial joint line tenderness is present McMurray compression testing is positive pedal pulses palpable ankle dorsi/plantar flexion intact    Assessment/Plan Impression is right knee anterior cruciate ligament tear medial meniscal tear plan anterior cruciate ligament reconstruction hamstring autograft partial medial meniscectomy risk benefits discussed with the patient including but limited to infection or vessel damage knee stiffness incomplete pain relief for more surgery all questions answered plan outpatient  procedure home with CPM start tomorrow need for full extension discussed with the patient plan physical therapy to start one week after surgery  DEAN,GREGORY SCOTT 08/19/2015, 11:50 AM

## 2015-08-19 NOTE — Anesthesia Procedure Notes (Addendum)
Anesthesia Regional Block:  Adductor canal block  Pre-Anesthetic Checklist: ,, timeout performed, Correct Patient, Correct Site, Correct Laterality, Correct Procedure, Correct Position, site marked, Risks and benefits discussed, pre-op evaluation,  At surgeon's request and post-op pain management  Laterality: Right  Prep: Maximum Sterile Barrier Precautions used and chloraprep       Needles:  Injection technique: Single-shot  Needle Type: Echogenic Stimulator Needle     Needle Length: 9cm 9 cm Needle Gauge: 21 and 21 G    Additional Needles:  Procedures: ultrasound guided (picture in chart) Adductor canal block Narrative:  Start time: 08/19/2015 10:05 AM End time: 08/19/2015 10:15 AM Injection made incrementally with aspirations every 5 mL. Anesthesiologist: Roderic Palau  Additional Notes: 2% Lidocaine skin wheel.    Procedure Name: Intubation Date/Time: 08/19/2015 12:15 PM Performed by: Adalberto Ill Pre-anesthesia Checklist: Patient identified, Emergency Drugs available, Suction available, Patient being monitored and Timeout performed Patient Re-evaluated:Patient Re-evaluated prior to inductionOxygen Delivery Method: Circle system utilized Preoxygenation: Pre-oxygenation with 100% oxygen Intubation Type: IV induction Ventilation: Mask ventilation without difficulty Laryngoscope Size: Miller and 2 Grade View: Grade I Tube type: Oral Tube size: 7.5 mm Number of attempts: 1 Placement Confirmation: ETT inserted through vocal cords under direct vision,  positive ETCO2,  CO2 detector and breath sounds checked- equal and bilateral Secured at: 23 cm Tube secured with: Tape Dental Injury: Teeth and Oropharynx as per pre-operative assessment

## 2015-08-20 ENCOUNTER — Encounter (HOSPITAL_COMMUNITY): Payer: Self-pay | Admitting: Orthopedic Surgery

## 2015-08-20 NOTE — Op Note (Signed)
NAME:  Adrian Mcneil, Adrian Mcneil NO.:  [id number]  MEDICAL RECORD NO.:  [id number]  LOCATION:  [facility]                         FACILITY:  [facility]  PHYSICIAN:  Anderson Malta, M.D.    DATE OF BIRTH:  19-Mar-1966  DATE OF PROCEDURE:  08/19/2015 DATE OF DISCHARGE:  08/19/2015                              OPERATIVE REPORT   PREOPERATIVE DIAGNOSIS:  Right knee anterior cruciate ligament tear, medial meniscal tear.  POSTOPERATIVE DIAGNOSIS:  Right knee anterior cruciate ligament tear, medial meniscal tear.  PROCEDURE:  Right knee ACL reconstruction, partial medial meniscectomy, hamstring autograft, semitendinosus, Arthrex EndoButton technique, 9 mm graft.  SURGEON:  Anderson Malta, M.D.  ASSISTANT:  [employer].  INDICATION:  [name] is a patient with right knee pain and instability, presents for operative management after explanation of risks and benefits.  PROCEDURE IN DETAIL:  The patient was brought to the operating room where general anesthetic was induced.  Preop antibiotics were administered.  Time-out was called.  Right leg prescrubbed with alcohol and Betadine, allowed to air dry, prepped with DuraPrep solution, and draped in the usual sterile manner.  Right leg was examined under anesthesia, found to have about 7 degrees of hyperextension, full flexion, stable collateral ligaments, intact PCL, torn ACL.  No posterolateral rotatory instability was noted.  At this time, right leg was prescrubbed with alcohol and Betadine, allowed to air dry, prepped with DuraPrep solution, and draped in the usual sterile manner.  [name] was used to cover the operative field.  Leg was elevated and exsanguinated with Esmarch wrap.  Tourniquet was inflated to 250 mmHg. The incision was made over the pes bursa tendon.  Semitendinosus was harvested and prepared on the back table using Arthrex dual EndoButton technique to 9 mm graft.  This was allowed to rest under tension. Concurrently,  anterior, inferior, lateral and anterior, inferior, medial portal established.  Diagnostic arthroscopy was performed.  The patient had intact patellofemoral compartment, intact lateral compartment, articular cartilage, and meniscus.  Torn ACL, intact PCL, intact medial compartment of articular cartilage, but the tear of the posterior horn of medial meniscus involving about 50% anterior-posterior width of the meniscus.  Meniscal tear was debrided back to stable rims with combination of basket punch and shavers.  Notchplasty performed.  ACL stump debrided over the top position, identified.  Femoral tunnel was drilled.  This was drilled with a flip cutter in the 9 o'clock Position. Tibial tunnel was then drilled using a foot cutter in the native ACL footprint.  Graft was passed and semi blast placed into both the femoral and tibial sockets.  The __________ graft was then tensioned in full extension with good isometry noted.  At this time, thorough irrigation was performed.  The tourniquet were released.  Bleeding points controlled with electrocautery.  Portals were closed using 2-0 Vicryl. 2-0 nylon harvest site, and closed using 0 Vicryl suture, 2-0 Vicryl suture, and Monocryl.  Solution of Marcaine, morphine, clonidine injected into the knee.  Bulky knee wrap and knee immobilizer placed. The patient transferred to recovery room in stable condition.     Anderson Malta, M.D.     GSD/MEDQ  D:  08/19/2015  T:  08/19/2015  Job:  [id number] 

## 2015-10-19 ENCOUNTER — Other Ambulatory Visit: Payer: Self-pay | Admitting: Family Medicine

## 2015-10-20 NOTE — Telephone Encounter (Signed)
Last seen 04/22/15 Last filled 04/07/2013

## 2016-01-06 ENCOUNTER — Other Ambulatory Visit: Payer: Self-pay | Admitting: Family Medicine

## 2016-01-07 NOTE — Telephone Encounter (Signed)
Is this okay to refill? 

## 2016-03-20 ENCOUNTER — Other Ambulatory Visit: Payer: Self-pay | Admitting: Family Medicine

## 2016-03-20 NOTE — Telephone Encounter (Signed)
Is this okay to refill? 

## 2016-05-09 LAB — HM COLONOSCOPY

## 2016-06-13 ENCOUNTER — Encounter: Payer: Self-pay | Admitting: Family Medicine

## 2016-08-11 ENCOUNTER — Telehealth: Payer: Self-pay | Admitting: Internal Medicine

## 2016-08-11 MED ORDER — TADALAFIL 5 MG PO TABS: 5.0000 mg | ORAL_TABLET | Freq: Every day | ORAL | 0 refills | Status: DC | PRN

## 2016-08-11 NOTE — Telephone Encounter (Signed)
Pt is coming to pick up 38m of cialis per [employer]

## 2016-09-14 ENCOUNTER — Encounter: Payer: Self-pay | Admitting: Family Medicine

## 2016-09-14 ENCOUNTER — Ambulatory Visit (INDEPENDENT_AMBULATORY_CARE_PROVIDER_SITE_OTHER): Payer: 59 | Admitting: Family Medicine

## 2016-09-14 VITALS — BP 140/90 | HR 88 | Wt 171.0 lb

## 2016-09-14 DIAGNOSIS — Z6379 Other stressful life events affecting family and household: Secondary | ICD-10-CM

## 2016-09-14 DIAGNOSIS — K5120 Ulcerative (chronic) proctitis without complications: Secondary | ICD-10-CM | POA: Diagnosis not present

## 2016-09-14 DIAGNOSIS — B001 Herpesviral vesicular dermatitis: Secondary | ICD-10-CM | POA: Diagnosis not present

## 2016-09-14 DIAGNOSIS — J029 Acute pharyngitis, unspecified: Secondary | ICD-10-CM | POA: Diagnosis not present

## 2016-09-14 DIAGNOSIS — Z209 Contact with and (suspected) exposure to unspecified communicable disease: Secondary | ICD-10-CM | POA: Diagnosis not present

## 2016-09-14 LAB — HIV ANTIBODY (ROUTINE TESTING W REFLEX): HIV: NONREACTIVE

## 2016-09-14 NOTE — Progress Notes (Addendum)
   Subjective:    Patient ID: Adrian Mcneil, male    DOB: April 20, 1966, 51 y.o.   MRN: [id number]  HPI He complains of intermittent difficulty over the last 8 months with sore throat, recently seeing an erythematous lesion on the soft palate. He also has been having issues with tonsillar discomfort but no fever, chills, earache, cough or congestion. He has underlying ulcerative colitis and is on medications for that. He also has a previous history of herpes labialis. Hasnot recently had an outbreak of herpes and apparently his ulcerative colitis is under adequate control. He is sexually active. He has been under a lot of stress dealing with his mother who has pancreatic cancer and apparently recently went to a Whipple procedure. He seems to be handling that fairly well but has definitely recognized the reality of his mortality.   Review of Systems     Objective:   Physical Exam Alert and in no distress. Tympanic membranes and canals are normal. Pharyngeal area is slightly erythematous. Tonsils do have crit cinnamon they're slightly erythematous but otherwise normal.. Neck is supple without adenopathy or thyromegaly. Cardiac exam shows a regular sinus rhythm without murmurs or gallops. Lungs are clear to auscultation.        Assessment & Plan:  Sore throat - Plan: HIV antibody, RPR, GC/CT Probe, Amp (Throat)  Contact with or exposure to communicable disease - Plan: HIV antibody, RPR, GC/CT Probe, Amp (Throat)  Stress due to illness of family member Discussed therapy for the sore throat in regard to STD. If this comes back negative I might be tempted to treat but must also consider ear nose and throat since there are some questionable lesions present. I then discussed the stress that he is under dealing with his mother and the problems she is having. He seems be handling this well also dealing with the reality of his mortality. Over 25 minutes, greater than 50% of time spent in  counseling and coordination of care All the labs are negative;dwill give Augmentin and if no improvement, will send to ENT

## 2016-09-15 LAB — RPR

## 2016-09-17 LAB — GC/CHLAMYDIA PROBE, AMP (THROAT)
CHLAMYDIA TRACHOMATIS RNA (THROAT) APTIMA: NOT DETECTED
NEISSERIA GONORRHOEAE RNA (THROAT): NOT DETECTED

## 2016-09-17 MED ORDER — AMOXICILLIN-POT CLAVULANATE 875-125 MG PO TABS: 1.0000 | ORAL_TABLET | Freq: Two times a day (BID) | ORAL | 0 refills | Status: DC

## 2016-09-17 NOTE — Addendum Note (Signed)
Addended by: Denita Lung on: 09/17/2016 06:40 PM   Modules accepted: Orders

## 2016-11-01 ENCOUNTER — Other Ambulatory Visit: Payer: Self-pay | Admitting: Family Medicine

## 2016-11-01 MED ORDER — SILDENAFIL CITRATE 20 MG PO TABS: ORAL_TABLET | ORAL | 5 refills | Status: DC

## 2016-11-06 DIAGNOSIS — R197 Diarrhea, unspecified: Secondary | ICD-10-CM | POA: Diagnosis not present

## 2016-11-06 DIAGNOSIS — K5100 Ulcerative (chronic) pancolitis without complications: Secondary | ICD-10-CM | POA: Diagnosis not present

## 2016-11-07 DIAGNOSIS — R197 Diarrhea, unspecified: Secondary | ICD-10-CM | POA: Diagnosis not present

## 2016-11-14 ENCOUNTER — Other Ambulatory Visit: Payer: Self-pay | Admitting: Gastroenterology

## 2016-11-14 ENCOUNTER — Other Ambulatory Visit (HOSPITAL_COMMUNITY): Payer: Self-pay | Admitting: Gastroenterology

## 2016-11-14 DIAGNOSIS — R197 Diarrhea, unspecified: Secondary | ICD-10-CM | POA: Diagnosis not present

## 2016-11-14 DIAGNOSIS — K5100 Ulcerative (chronic) pancolitis without complications: Secondary | ICD-10-CM | POA: Diagnosis not present

## 2016-11-14 DIAGNOSIS — R109 Unspecified abdominal pain: Secondary | ICD-10-CM

## 2016-11-17 ENCOUNTER — Ambulatory Visit (HOSPITAL_COMMUNITY)
Admission: RE | Admit: 2016-11-17 | Discharge: 2016-11-17 | Disposition: A | Discharge status: Home / Self Care | Payer: 59 | Source: Ambulatory Visit | Attending: Gastroenterology | Admitting: Gastroenterology

## 2016-11-17 DIAGNOSIS — R109 Unspecified abdominal pain: Secondary | ICD-10-CM | POA: Insufficient documentation

## 2016-12-17 ENCOUNTER — Telehealth: Payer: Self-pay | Admitting: Family Medicine

## 2016-12-17 NOTE — Telephone Encounter (Signed)
P.A. DELZICOL

## 2016-12-23 NOTE — Telephone Encounter (Signed)
P.A. Denied for Delzicol, pt needs trial of ALL alternatives, Balsalazide, sulfasalazine, Apriso, Lialda, Pentasa.  Do you want to switch?

## 2016-12-26 NOTE — Telephone Encounter (Signed)
He should be getting this from his GI doctor

## 2016-12-29 NOTE — Telephone Encounter (Signed)
Called pt and he got a letter and his GI doctor is handling

## 2017-01-04 ENCOUNTER — Encounter: Payer: Self-pay | Admitting: Family Medicine

## 2017-01-04 ENCOUNTER — Ambulatory Visit (INDEPENDENT_AMBULATORY_CARE_PROVIDER_SITE_OTHER): Payer: 59 | Admitting: Family Medicine

## 2017-01-04 VITALS — BP 130/84 | HR 103 | Wt 165.0 lb

## 2017-01-04 DIAGNOSIS — K5120 Ulcerative (chronic) proctitis without complications: Secondary | ICD-10-CM | POA: Diagnosis not present

## 2017-01-04 DIAGNOSIS — J0100 Acute maxillary sinusitis, unspecified: Secondary | ICD-10-CM | POA: Diagnosis not present

## 2017-01-04 DIAGNOSIS — K219 Gastro-esophageal reflux disease without esophagitis: Secondary | ICD-10-CM

## 2017-01-04 DIAGNOSIS — J301 Allergic rhinitis due to pollen: Secondary | ICD-10-CM | POA: Diagnosis not present

## 2017-01-04 DIAGNOSIS — L739 Follicular disorder, unspecified: Secondary | ICD-10-CM

## 2017-01-04 MED ORDER — LEVOFLOXACIN 500 MG PO TABS: 500.0000 mg | ORAL_TABLET | Freq: Every day | ORAL | 0 refills | Status: DC

## 2017-01-04 MED ORDER — CLINDAMYCIN PHOSPHATE 1 % EX GEL: Freq: Every day | CUTANEOUS | 5 refills | Status: DC

## 2017-01-04 NOTE — Progress Notes (Signed)
   Subjective:    Patient ID: Adrian Mcneil, male    DOB: [birth year], 51 y.o.   MRN: [id number]  HPI he complains of a one-week history of sore throat mainly on the left with postnasal drainage, sinus pressure but no earache, fever, chills. He does not smoke. His allergies are under good control. He did have difficulty recently with sinus infection and had difficulty after that with his underlying ulcerative colitis. He has a history of reflux disease but presently is having no reflux type symptoms. He would also like a refill on when the mycin gel. He uses this on follicular lesions and finds it to be quite useful. Apparently it was given to him by dermatologist several years ago.  Review of Systems     Objective:   Physical Exam Alert and in no distress. Nasal mucosa is normal. He is tender over frontal as well as maxillary sinuses. Tympanic membranes and canals are normal. Pharyngeal area is normal. Neck is supple without adenopathy or thyromegaly. Cardiac exam shows a regular sinus rhythm without murmurs or gallops. Lungs are clear to auscultation.        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 I will give him Levaquin and also recommend that he use probiotics. No therapy for the reflux as he is not having trouble. He will continue on his ulcerative colitis medications. After he left the office he did convey back that he was able to loosen up a tonsillith which was causing the trouble on the left. It was not visible to me when I saw him.

## 2017-01-04 NOTE — Patient Instructions (Addendum)
Use Hibiclens once or twice a week Take a probiotic regularly

## 2017-03-29 DIAGNOSIS — D485 Neoplasm of uncertain behavior of skin: Secondary | ICD-10-CM | POA: Diagnosis not present

## 2017-03-29 DIAGNOSIS — B078 Other viral warts: Secondary | ICD-10-CM | POA: Diagnosis not present

## 2017-03-29 DIAGNOSIS — Z85828 Personal history of other malignant neoplasm of skin: Secondary | ICD-10-CM | POA: Diagnosis not present

## 2017-03-29 DIAGNOSIS — D225 Melanocytic nevi of trunk: Secondary | ICD-10-CM | POA: Diagnosis not present

## 2017-03-29 DIAGNOSIS — L570 Actinic keratosis: Secondary | ICD-10-CM | POA: Diagnosis not present

## 2017-03-29 DIAGNOSIS — L820 Inflamed seborrheic keratosis: Secondary | ICD-10-CM | POA: Diagnosis not present

## 2017-03-29 DIAGNOSIS — L821 Other seborrheic keratosis: Secondary | ICD-10-CM | POA: Diagnosis not present

## 2017-07-10 ENCOUNTER — Other Ambulatory Visit: Payer: Self-pay | Admitting: Family Medicine

## 2017-07-10 MED ORDER — SILDENAFIL CITRATE 20 MG PO TABS: ORAL_TABLET | ORAL | 5 refills | Status: DC

## 2017-08-09 DIAGNOSIS — K5100 Ulcerative (chronic) pancolitis without complications: Secondary | ICD-10-CM | POA: Diagnosis not present

## 2017-12-10 ENCOUNTER — Other Ambulatory Visit: Payer: Self-pay | Admitting: Family Medicine

## 2017-12-10 NOTE — Telephone Encounter (Signed)
[facility]. Please advise [facility]

## 2018-01-22 ENCOUNTER — Ambulatory Visit: Payer: 59 | Admitting: Family Medicine

## 2018-01-23 ENCOUNTER — Ambulatory Visit: Payer: 59 | Admitting: Family Medicine

## 2018-01-23 ENCOUNTER — Encounter: Payer: Self-pay | Admitting: Family Medicine

## 2018-01-23 VITALS — BP 102/68 | HR 97 | Temp 99.1°F | Wt 169.2 lb

## 2018-01-23 DIAGNOSIS — J301 Allergic rhinitis due to pollen: Secondary | ICD-10-CM | POA: Diagnosis not present

## 2018-01-23 DIAGNOSIS — R1310 Dysphagia, unspecified: Secondary | ICD-10-CM

## 2018-01-23 DIAGNOSIS — Z8249 Family history of ischemic heart disease and other diseases of the circulatory system: Secondary | ICD-10-CM

## 2018-01-23 DIAGNOSIS — K219 Gastro-esophageal reflux disease without esophagitis: Secondary | ICD-10-CM

## 2018-01-23 DIAGNOSIS — K5120 Ulcerative (chronic) proctitis without complications: Secondary | ICD-10-CM

## 2018-01-23 DIAGNOSIS — E785 Hyperlipidemia, unspecified: Secondary | ICD-10-CM

## 2018-01-23 NOTE — Progress Notes (Signed)
   Subjective:    Patient ID: Adrian Mcneil, male    DOB: 1965/11/16, 52 y.o.   MRN: [id number]  HPI He is here for consult concerning multiple issues.  He complains of a one-month history of difficulty with slight cough, PND, itchy watery eyes, sneezing.  He also notes difficulty swallowing but mainly notes that when he lies down, turns his head to the right and attempts to swallow.  Swallow liquids and solids without difficulty.  He has no sensation of food getting stuck. He also has a positive family history of heart disease.  Presently he is on red yeast rice and is also using omega-3.  He has had no chest pain, shortness of breath, PND or DOE. He does have ulcerative colitis and gets regular follow-up concerning that.  Recommend he treat his allergies more aggressively with  Review of Systems     Objective:   Physical Exam Alert and in no distress. Tympanic membranes and canals are normal. Pharyngeal area is normal. Neck is supple without adenopathy or thyromegaly. Cardiac exam shows a regular sinus rhythm without murmurs or gallops. Lungs are clear to auscultation. EKG shows no acute changes.       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  He will continue on his medication for the ulcerative colitis.  Discussed the fact that we might need to add a regular statin drug to his regimen.  He does have a history of reflux.

## 2018-01-24 DIAGNOSIS — L738 Other specified follicular disorders: Secondary | ICD-10-CM | POA: Diagnosis not present

## 2018-01-24 DIAGNOSIS — C44519 Basal cell carcinoma of skin of other part of trunk: Secondary | ICD-10-CM | POA: Diagnosis not present

## 2018-01-24 DIAGNOSIS — D224 Melanocytic nevi of scalp and neck: Secondary | ICD-10-CM | POA: Diagnosis not present

## 2018-01-24 DIAGNOSIS — L570 Actinic keratosis: Secondary | ICD-10-CM | POA: Diagnosis not present

## 2018-01-24 LAB — CBC WITH DIFFERENTIAL/PLATELET
BASOS ABS: 0.1 10*3/uL (ref 0.0–0.2)
Basos: 1 %
EOS (ABSOLUTE): 0.1 10*3/uL (ref 0.0–0.4)
Eos: 2 %
HEMOGLOBIN: 14.4 g/dL (ref 13.0–17.7)
Hematocrit: 41 % (ref 37.5–51.0)
IMMATURE GRANS (ABS): 0 10*3/uL (ref 0.0–0.1)
Immature Granulocytes: 0 %
LYMPHS: 28 %
Lymphocytes Absolute: 2.1 10*3/uL (ref 0.7–3.1)
MCH: 30.1 pg (ref 26.6–33.0)
MCHC: 35.1 g/dL (ref 31.5–35.7)
MCV: 86 fL (ref 79–97)
MONOCYTES: 9 %
Monocytes Absolute: 0.7 10*3/uL (ref 0.1–0.9)
Neutrophils Absolute: 4.6 10*3/uL (ref 1.4–7.0)
Neutrophils: 60 %
PLATELETS: 283 10*3/uL (ref 150–450)
RBC: 4.78 x10E6/uL (ref 4.14–5.80)
RDW: 13.2 % (ref 12.3–15.4)
WBC: 7.5 10*3/uL (ref 3.4–10.8)

## 2018-01-24 LAB — LIPID PANEL
Chol/HDL Ratio: 5.1 ratio — ABNORMAL HIGH (ref 0.0–5.0)
Cholesterol, Total: 215 mg/dL — ABNORMAL HIGH (ref 100–199)
HDL: 42 mg/dL (ref >39)
LDL CALC: 142 mg/dL — AB (ref 0–99)
TRIGLYCERIDES: 157 mg/dL — AB (ref 0–149)
VLDL CHOLESTEROL CAL: 31 mg/dL (ref 5–40)

## 2018-01-24 LAB — COMPREHENSIVE METABOLIC PANEL
ALBUMIN: 4.5 g/dL (ref 3.5–5.5)
ALT: 18 IU/L (ref 0–44)
AST: 16 IU/L (ref 0–40)
Albumin/Globulin Ratio: 1.5 (ref 1.2–2.2)
Alkaline Phosphatase: 86 IU/L (ref 39–117)
BUN / CREAT RATIO: 15 (ref 9–20)
BUN: 18 mg/dL (ref 6–24)
Bilirubin Total: 0.5 mg/dL (ref 0.0–1.2)
CO2: 25 mmol/L (ref 20–29)
CREATININE: 1.22 mg/dL (ref 0.76–1.27)
Calcium: 9.5 mg/dL (ref 8.7–10.2)
Chloride: 101 mmol/L (ref 96–106)
GFR calc Af Amer: 79 mL/min/{1.73_m2} (ref >59)
GFR calc non Af Amer: 69 mL/min/{1.73_m2} (ref >59)
GLUCOSE: 92 mg/dL (ref 65–99)
Globulin, Total: 3.1 g/dL (ref 1.5–4.5)
Potassium: 4.4 mmol/L (ref 3.5–5.2)
Sodium: 141 mmol/L (ref 134–144)
Total Protein: 7.6 g/dL (ref 6.0–8.5)

## 2018-01-24 MED ORDER — ATORVASTATIN CALCIUM 20 MG PO TABS: 20.0000 mg | ORAL_TABLET | Freq: Every day | ORAL | 3 refills | Status: DC

## 2018-01-24 NOTE — Addendum Note (Signed)
Addended by: Denita Lung on: 01/24/2018 10:25 AM   Modules accepted: Orders

## 2018-01-29 ENCOUNTER — Telehealth: Payer: Self-pay

## 2018-01-29 NOTE — Telephone Encounter (Signed)
Called pt and gave him info about heart care appt 02-12-18 at 9 am with Dr. Golden Hurter [facility]

## 2018-02-12 ENCOUNTER — Ambulatory Visit (INDEPENDENT_AMBULATORY_CARE_PROVIDER_SITE_OTHER)
Admission: RE | Admit: 2018-02-12 | Discharge: 2018-02-12 | Disposition: A | Discharge status: Home / Self Care | Payer: Self-pay | Source: Ambulatory Visit | Attending: Cardiology | Admitting: Cardiology

## 2018-02-12 ENCOUNTER — Encounter: Payer: Self-pay | Admitting: Cardiology

## 2018-02-12 ENCOUNTER — Ambulatory Visit: Payer: 59 | Admitting: Cardiology

## 2018-02-12 VITALS — BP 118/62 | HR 100 | Ht 70.0 in | Wt 171.4 lb

## 2018-02-12 DIAGNOSIS — Z8249 Family history of ischemic heart disease and other diseases of the circulatory system: Secondary | ICD-10-CM

## 2018-02-12 DIAGNOSIS — E785 Hyperlipidemia, unspecified: Secondary | ICD-10-CM

## 2018-02-12 NOTE — Progress Notes (Signed)
Cardiology Office Note    Date:  02/12/2018   ID:  Adrian Mcneil, DOB 1966-01-07, MRN [id number]  PCP:  Denita Lung, MD  Cardiologist:  Fransico Him, MD   Chief Complaint  Patient presents with  . New Patient (Initial Visit)    Family history of CAD    History of Present Illness:  Adrian Mcneil is a 52 y.o. male who is being seen today for the evaluation of a history of CAD at the request of Denita Lung, MD.  This is a very pleasant 52 year old male with a history of hyperlipidemia, GERD and a family history of premature CAD who is referred for cardiac evaluation due to family history.   He says that his father had his first MI at 110 and then subsequent PCI x2.  He was a heavy smoker.  His mom has pancreatic CA.  He does not know of any other family members with CAD at this time.  He does have hyperlipidemia and his PCP want to start him on Lipitor but he was concerned about starting a statin as his mother had allergic reaction to statins.  He chose to try red yeast rice instead at this time.  He denies any chest pain or pressure, SOB, DOE, PND, orthopnea, LE edema, dizziness, palpitations or syncope. He is compliant with his meds and is tolerating meds with no SE.    Past Medical History:  Diagnosis Date  . Acid indigestion   . Allergic rhinitis, cause unspecified    seasonal  . Cancer ([place])    skin  . Dyslipidemia   . Erectile dysfunction   . FHx: cardiovascular disease   . Hyperlipidemia   . Ulcerative colitis    Dr. Oletta Lamas    Past Surgical History:  Procedure Laterality Date  . ANTERIOR CRUCIATE LIGAMENT REPAIR Right 08/19/2015   Procedure: RECONSTRUCTION ANTERIOR CRUCIATE LIGAMENT (ACL) WITH HAMSTRING GRAFT, PARTIAL MEDIAL MENISECTOMY.  ;  Surgeon: Meredith Pel, MD;  Location: [place];  Service: Orthopedics;  Laterality: Right;  . COLONOSCOPY  2007   Dr. Oletta Lamas    Current Medications: Current Meds  Medication Sig  . atorvastatin  (LIPITOR) 20 MG tablet Take 1 tablet (20 mg total) by mouth daily.  . clindamycin (CLINDAGEL) 1 % gel Apply topically daily. as directed  . co-enzyme Q-10 30 MG capsule Take 30 mg by mouth daily.  . mesalamine (APRISO) 0.375 g 24 hr capsule Take 375 mg by mouth daily.  Marland Kitchen omega-3 acid ethyl esters (LOVAZA) 1 g capsule Take 1 g by mouth daily.  . Red Yeast Rice Extract (RED YEAST RICE PO) Take 2 capsules by mouth daily.  . sildenafil (REVATIO) 20 MG tablet Take up to 5 pills as needed daily for erection.  . valACYclovir (VALTREX) 1000 MG tablet TAKE 2 TABLETS TWICE PER DAY    Allergies:   Patient has no known allergies.   Social History   Socioeconomic History  . Marital status: Single    Spouse name: Not on file  . Number of children: Not on file  . Years of education: Not on file  . Highest education level: Not on file  Occupational History  . Occupation: Games developer: [employer]  . Financial resource strain: Not on file  . Food insecurity:    Worry: Not on file    Inability: Not on file  . Transportation needs:    Medical: Not on file  Non-medical: Not on file  Tobacco Use  . Smoking status: Never Smoker  . Smokeless tobacco: Never Used  Substance and Sexual Activity  . Alcohol use: Yes    Comment: socially, 1-2 drinks once or twice a week  . Drug use: No  . Sexual activity: Not Currently    Partners: Male  Lifestyle  . Physical activity:    Days per week: Not on file    Minutes per session: Not on file  . Stress: Not on file  Relationships  . Social connections:    Talks on phone: Not on file    Gets together: Not on file    Attends religious service: Not on file    Active member of club or organization: Not on file    Attends meetings of clubs or organizations: Not on file    Relationship status: Not on file  Other Topics Concern  . Not on file  Social History Narrative  . Not on file     Family History:  The patient's family  history includes Cancer in his sister; Heart disease (age of onset: 5) in his father; Hyperlipidemia in his mother.   ROS:   Please see the history of present illness.    ROS All other systems reviewed and are negative.  No flowsheet data found.     PHYSICAL EXAM:   VS:  BP 118/62 (BP Location: Right Arm, Patient Position: Sitting, Cuff Size: Normal)   Pulse 100   Ht 5' 10"  (1.778 m)   Wt 171 lb 6.4 oz (77.7 kg)   SpO2 99%   BMI 24.59 kg/m    GEN: Well nourished, well developed, in no acute distress  HEENT: normal  Neck: no JVD, carotid bruits, or masses Cardiac: RRR; no murmurs, rubs, or gallops,no edema.  Intact distal pulses bilaterally.  Respiratory:  clear to auscultation bilaterally, normal work of breathing GI: soft, nontender, nondistended, + BS MS: no deformity or atrophy  Skin: warm and dry, no rash Neuro:  Alert and Oriented x 3, Strength and sensation are intact Psych: euthymic mood, full affect  Wt Readings from Last 3 Encounters:  02/12/18 171 lb 6.4 oz (77.7 kg)  01/23/18 169 lb 3.2 oz (76.7 kg)  01/04/17 165 lb (74.8 kg)      Studies/Labs Reviewed:   EKG:  EKG is not ordered today.  The ekg on 01/23/2018 showed normal sinus rhythm with no ST changes.  Recent Labs: 01/23/2018: ALT 18; BUN 18; Creatinine, Ser 1.22; Hemoglobin 14.4; Platelets 283; Potassium 4.4; Sodium 141   Lipid Panel    Component Value Date/Time   CHOL 215 (H) 01/23/2018 1636   TRIG 157 (H) 01/23/2018 1636   HDL 42 01/23/2018 1636   CHOLHDL 5.1 (H) 01/23/2018 1636   CHOLHDL 5.9 07/27/2014 1506   VLDL 49 (H) 07/27/2014 1506   LDLCALC 142 (H) 01/23/2018 1636    Additional studies/ records that were reviewed today include:  Office notes from PCP    ASSESSMENT:    1. Family history of heart disease in male family member before age 84   2. Hyperlipidemia with target LDL less than 100      PLAN:  In order of problems listed above:  1.  Family history of heart disease -  his father had his first MI at 7 and then to PCI subsequent.  He was a heavy smoker.  He does not have a known family history other than his father of CAD.  His EKG  is normal that was done in his PCP office.  He is completely asymptomatic and is very active and exercises without any cardiac symptoms.  He has never smoked.  I have recommended that we proceed with an exercise tread mill test to rule out ischemia.  I will also get a coronary calcium score to assess the degree of coronary calcification.  I explained to him that if his calcium score is 0 than he has a very low risk of developing significant CAD in the next 5 years.  Patient was very interested in proceeding straight to cardiac catheterization but I explained to him that without any symptoms and with a normal EKG I suspect his chances of having significant CAD are low.  We would need evidence on noninvasive testing of ischemia prior to proceeding with heart cath.  2.  Hyperlipidemia with LDL goal < 70.  His LDL was 142 on 01/23/2018 with a total cholesterol 215 and HDL 42.  He given a prescription for atorvastatin but he has not started that and is taking red yeast rice instead because he is concerned that his mom had allergic reaction to statins.   Medication Adjustments/Labs and Tests Ordered: Current medicines are reviewed at length with the patient today.  Concerns regarding medicines are outlined above.  Medication changes, Labs and Tests ordered today are listed in the Patient Instructions below.  Patient Instructions  Medication Instructions:  Your physician recommends that you continue on your current medications as directed. Please refer to the Current Medication list given to you today.  Labwork: NONE  Testing/Procedures: NONE  Follow-Up: Your physician wants you to follow-up in: 12 months with Dr. Radford Pax. You will receive a reminder letter in the mail two months in advance. If you don't receive a letter, please call our  office to schedule the follow-up appointment.   If you need a refill on your cardiac medications before your next appointment, please call your pharmacy.       Signed, Fransico Him, MD  02/12/2018 9:24 AM    [address], [place], [place]  [address] Phone: [phone]; Fax: [phone]

## 2018-02-12 NOTE — Patient Instructions (Addendum)
Medication Instructions:  Your physician recommends that you continue on your current medications as directed. Please refer to the Current Medication list given to you today.  Labwork: NONE  Testing/Procedures: Your physician has requested that you have an exercise tolerance test. For further information please visit [email or web address]. Please also follow instruction sheet, as given.  Cardiac CT scanning for Calcium score, (CAT scanning), is a noninvasive, special x-ray that produces cross-sectional images of the body using x-rays and a computer. CT scans help physicians diagnose and treat medical conditions. For some CT exams, a contrast material is used to enhance visibility in the area of the body being studied. CT scans provide greater clarity and reveal more details than regular x-ray exams.  Follow-Up: Your physician wants you to follow-up as needed with Dr. Radford Pax.   If you need a refill on your cardiac medications before your next appointment, please call your pharmacy.

## 2018-03-19 DIAGNOSIS — R1312 Dysphagia, oropharyngeal phase: Secondary | ICD-10-CM | POA: Diagnosis not present

## 2018-03-22 ENCOUNTER — Other Ambulatory Visit (INDEPENDENT_AMBULATORY_CARE_PROVIDER_SITE_OTHER): Payer: Self-pay | Admitting: Otolaryngology

## 2018-03-25 ENCOUNTER — Other Ambulatory Visit (HOSPITAL_COMMUNITY): Payer: Self-pay | Admitting: Otolaryngology

## 2018-03-25 DIAGNOSIS — R1310 Dysphagia, unspecified: Secondary | ICD-10-CM

## 2018-04-02 ENCOUNTER — Ambulatory Visit (HOSPITAL_COMMUNITY)
Admission: RE | Admit: 2018-04-02 | Discharge: 2018-04-02 | Disposition: A | Discharge status: Home / Self Care | Payer: 59 | Source: Ambulatory Visit | Attending: Otolaryngology | Admitting: Otolaryngology

## 2018-04-02 DIAGNOSIS — K219 Gastro-esophageal reflux disease without esophagitis: Secondary | ICD-10-CM | POA: Insufficient documentation

## 2018-04-02 DIAGNOSIS — R1310 Dysphagia, unspecified: Secondary | ICD-10-CM | POA: Diagnosis not present

## 2018-04-02 DIAGNOSIS — E785 Hyperlipidemia, unspecified: Secondary | ICD-10-CM | POA: Insufficient documentation

## 2018-04-04 ENCOUNTER — Ambulatory Visit (INDEPENDENT_AMBULATORY_CARE_PROVIDER_SITE_OTHER): Payer: 59

## 2018-04-04 DIAGNOSIS — E785 Hyperlipidemia, unspecified: Secondary | ICD-10-CM | POA: Diagnosis not present

## 2018-04-04 DIAGNOSIS — Z8249 Family history of ischemic heart disease and other diseases of the circulatory system: Secondary | ICD-10-CM | POA: Diagnosis not present

## 2018-04-04 LAB — EXERCISE TOLERANCE TEST
CSEPEW: 12.4 METS
Exercise duration (min): 10 min
Exercise duration (sec): 25 s
MPHR: 170 {beats}/min
Peak HR: 169 {beats}/min
Percent HR: 99 %
RPE: 17
Rest HR: 88 {beats}/min

## 2018-04-10 ENCOUNTER — Other Ambulatory Visit: Payer: Self-pay | Admitting: Family Medicine

## 2018-04-10 MED ORDER — SILDENAFIL CITRATE 20 MG PO TABS: ORAL_TABLET | ORAL | 5 refills | Status: DC

## 2018-04-10 NOTE — Telephone Encounter (Signed)
[facility] is requesting to fill pt sildenafil. Please advise [facility]

## 2018-04-10 NOTE — Telephone Encounter (Deleted)
Adrian Mcneil is requesting to fill pt sildenafil. Please advise . [place]

## 2018-04-19 ENCOUNTER — Other Ambulatory Visit: Payer: Self-pay | Admitting: Family Medicine

## 2018-04-19 NOTE — Telephone Encounter (Signed)
[facility] [place]

## 2018-05-06 ENCOUNTER — Encounter: Payer: Self-pay | Admitting: Family Medicine

## 2018-05-06 ENCOUNTER — Ambulatory Visit: Payer: 59 | Admitting: Family Medicine

## 2018-05-06 VITALS — BP 128/80 | HR 100 | Temp 99.1°F | Wt 168.8 lb

## 2018-05-06 DIAGNOSIS — Z209 Contact with and (suspected) exposure to unspecified communicable disease: Secondary | ICD-10-CM | POA: Diagnosis not present

## 2018-05-06 NOTE — Progress Notes (Signed)
   Subjective:    Patient ID: Adrian Mcneil, male    DOB: [birth year], 52 y.o.   MRN: [id number]  HPI Approximately 2 weeks ago he had a oral sexual encounter and since then is noted to slightly erythematous lesions on his penis on the shaft.  It was not ulcerated or painful.  No discharge is noted.  No dysuria or frequency.  No sore throat.   Review of Systems     Objective:   Physical Exam Alert and in no distress.  Exam of the penis does show to slightly erythematous healing dry lesions on the shaft of the penis on the left.       Assessment & Plan:  Contact with or exposure to communicable disease - Plan: RPR+HIV+GC+CT Panel I explained that I doubt that this was anything significant but will do STD testing on him to be safe.  He is comfortable with that.

## 2018-05-08 ENCOUNTER — Telehealth: Payer: Self-pay

## 2018-05-08 DIAGNOSIS — L739 Follicular disorder, unspecified: Secondary | ICD-10-CM

## 2018-05-08 LAB — RPR+HIV+GC+CT PANEL
Chlamydia trachomatis, NAA: NEGATIVE
HIV Screen 4th Generation wRfx: NONREACTIVE
Neisseria gonorrhoeae by PCR: NEGATIVE
RPR: NONREACTIVE

## 2018-05-08 MED ORDER — CLINDAMYCIN PHOSPHATE 1 % EX GEL: Freq: Every day | CUTANEOUS | 5 refills | Status: DC

## 2018-05-08 NOTE — Telephone Encounter (Signed)
Called pt to give labs and pt advised that he needs script for clindamycin. Please advise. [place]

## 2018-05-08 NOTE — Addendum Note (Signed)
Addended by: Denita Lung on: 05/08/2018 09:25 PM   Modules accepted: Orders

## 2018-10-11 ENCOUNTER — Other Ambulatory Visit: Payer: Self-pay | Admitting: Family Medicine

## 2018-10-11 NOTE — Telephone Encounter (Signed)
Is this okay to refill? 

## 2018-12-13 ENCOUNTER — Other Ambulatory Visit: Payer: Self-pay | Admitting: Family Medicine

## 2018-12-13 MED ORDER — TRIAMCINOLONE ACETONIDE 0.5 % EX CREA: 1.0000 "application " | TOPICAL_CREAM | Freq: Three times a day (TID) | CUTANEOUS | 1 refills | Status: DC

## 2019-01-20 ENCOUNTER — Other Ambulatory Visit: Payer: Self-pay | Admitting: Family Medicine

## 2019-01-20 NOTE — Telephone Encounter (Signed)
[facility] is requesting to fill pt sildenafil. Please advise [facility]

## 2019-03-26 ENCOUNTER — Ambulatory Visit: Payer: BC Managed Care – PPO | Admitting: Family Medicine

## 2019-03-26 ENCOUNTER — Other Ambulatory Visit: Payer: Self-pay

## 2019-03-26 VITALS — BP 120/78 | HR 109 | Temp 97.8°F | Wt 166.0 lb

## 2019-03-26 DIAGNOSIS — Z209 Contact with and (suspected) exposure to unspecified communicable disease: Secondary | ICD-10-CM | POA: Diagnosis not present

## 2019-03-26 DIAGNOSIS — Z23 Encounter for immunization: Secondary | ICD-10-CM

## 2019-03-26 NOTE — Progress Notes (Signed)
   Subjective:    Patient ID: Adrian Mcneil, male    DOB: [birth year], 53 y.o.   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  Since then he has had other sexual encounters but does not complain of any lesions on his penis, dysuria, discharge, rashes.   Review of Systems     Objective:   Physical Exam Alert and in no distress.  Exam of his abdomen and extremities as well as hands shows no lesions.      Assessment & Plan:  Contact with or exposure to communicable disease - Plan: RPR+HIV+GC+CT Panel  Need for influenza vaccination - Plan: Flu Vaccine QUAD 6+ mos PF IM (Fluarix Quad PF) I will do routine screening on him.  Discussed the signs and symptoms of syphilis with him.  Also encouraged him to start prep.  Discussed the benefits and the protocol for this.

## 2019-03-29 LAB — RPR+HIV+GC+CT PANEL
Chlamydia trachomatis, NAA: NEGATIVE
HIV Screen 4th Generation wRfx: NONREACTIVE
Neisseria Gonorrhoeae by PCR: NEGATIVE
RPR Ser Ql: NONREACTIVE

## 2019-04-02 ENCOUNTER — Other Ambulatory Visit: Payer: Self-pay | Admitting: Family Medicine

## 2019-04-02 MED ORDER — SILDENAFIL CITRATE 20 MG PO TABS: ORAL_TABLET | ORAL | 5 refills | Status: DC

## 2019-06-07 ENCOUNTER — Other Ambulatory Visit: Payer: Self-pay | Admitting: Family Medicine

## 2019-06-09 NOTE — Telephone Encounter (Signed)
[facility]. Please advise [facility]

## 2019-06-11 ENCOUNTER — Ambulatory Visit: Payer: BC Managed Care – PPO | Admitting: Family Medicine

## 2019-06-11 ENCOUNTER — Encounter: Payer: Self-pay | Admitting: Family Medicine

## 2019-06-11 ENCOUNTER — Other Ambulatory Visit: Payer: Self-pay

## 2019-06-11 VITALS — BP 130/76 | HR 117 | Temp 96.9°F | Wt 166.2 lb

## 2019-06-11 DIAGNOSIS — Z209 Contact with and (suspected) exposure to unspecified communicable disease: Secondary | ICD-10-CM

## 2019-06-11 DIAGNOSIS — Z7189 Other specified counseling: Secondary | ICD-10-CM | POA: Diagnosis not present

## 2019-06-11 DIAGNOSIS — N489 Disorder of penis, unspecified: Secondary | ICD-10-CM | POA: Diagnosis not present

## 2019-06-11 NOTE — Progress Notes (Signed)
   Subjective:    Patient ID: Adrian Mcneil, male    DOB: 01-18-1966, 53 y.o.   MRN: [id number]  HPI He noted a lesion on the shaft of his penis while in the shower.  It is nontender, no drainage.  He has had no dysuria, frequency, urgency or discharge.  His last sexual activity was 2 weeks ago.  He would like STD testing. Also of note is the fact that his mother died recently after being treated for pancreatic cancer.  He does feel somewhat depressed over this.  He did become tearful.   Review of Systems     Objective:   Physical Exam Alert and in no distress.  Exam of the penis does show a less than 0.5 cm lesion present on the shaft of the penis that does appear to be healing.       Assessment & Plan:  Bereavement counseling: I discussed death and dying with him in detail.  He seems to be handling this well and the fact that he is slightly depressed is certainly normal.  Discussed the possible use of an antidepressant but not at this point.  Strongly encouraged him to get involved in bereavement counseling through hospice.  The number was given to him.  Contact with or exposure to communicable disease - Plan: RPR+HIV+GC+CT Panel  Penile lesion: I explained that the penile lesion is probably nothing to be of any concern.  It did not appear to be herpetic or syphilitic in nature.  Explained that it was probably a scratch that is slowly healing.  He was comfortable with that.

## 2019-06-11 NOTE — Patient Instructions (Signed)
Call [phone]

## 2019-06-13 LAB — RPR+HIV+GC+CT PANEL
Chlamydia trachomatis, NAA: NEGATIVE
HIV Screen 4th Generation wRfx: NONREACTIVE
Neisseria Gonorrhoeae by PCR: NEGATIVE
RPR Ser Ql: NONREACTIVE

## 2019-07-30 ENCOUNTER — Other Ambulatory Visit: Payer: Self-pay | Admitting: Family Medicine

## 2019-07-30 NOTE — Telephone Encounter (Signed)
[facility] to [facility]. Please advise. [place]

## 2019-08-01 ENCOUNTER — Other Ambulatory Visit: Payer: Self-pay | Admitting: Family Medicine

## 2019-08-01 MED ORDER — VALACYCLOVIR HCL 1 G PO TABS: ORAL_TABLET | ORAL | 2 refills | Status: DC

## 2019-08-26 ENCOUNTER — Other Ambulatory Visit: Payer: Self-pay | Admitting: Family Medicine

## 2019-08-26 NOTE — Telephone Encounter (Signed)
[facility] to [facility] pt valtrex. Please advise [facility]

## 2019-09-10 ENCOUNTER — Other Ambulatory Visit: Payer: Self-pay | Admitting: Family Medicine

## 2019-09-10 DIAGNOSIS — L739 Follicular disorder, unspecified: Secondary | ICD-10-CM

## 2019-09-10 MED ORDER — CLINDAMYCIN PHOSPHATE 1 % EX GEL: Freq: Every day | CUTANEOUS | 5 refills | Status: DC

## 2019-11-05 ENCOUNTER — Other Ambulatory Visit: Payer: Self-pay

## 2019-11-05 ENCOUNTER — Encounter: Payer: Self-pay | Admitting: Family Medicine

## 2019-11-05 ENCOUNTER — Ambulatory Visit: Payer: BC Managed Care – PPO | Admitting: Family Medicine

## 2019-11-05 VITALS — BP 130/88 | HR 101 | Temp 97.5°F | Wt 168.4 lb

## 2019-11-05 DIAGNOSIS — R1032 Left lower quadrant pain: Secondary | ICD-10-CM | POA: Diagnosis not present

## 2019-11-05 DIAGNOSIS — Z209 Contact with and (suspected) exposure to unspecified communicable disease: Secondary | ICD-10-CM | POA: Diagnosis not present

## 2019-11-05 NOTE — Progress Notes (Signed)
   Subjective:    Patient ID: Adrian Mcneil, male    DOB: [birth year], 54 y.o.   MRN: [id number]  HPI He states that earlier today while leaning against the countertop he noted some left inguinal tenderness.  He has had no nausea, vomiting, urinary symptoms including dysuria or discharge.  No lesions on his legs.  He has been sexually active and would like to be tested.   Review of Systems     Objective:   Physical Exam Alert and in no distress.  Lower abdominal exam does show some slight discomfort on palpation just superior to the inguinal ligament.  No lesions were palpable.  No adenopathy noted.  Penis and testes normal.       Assessment & Plan:  Inguinal pain, left  Contact with or exposure to communicable disease - Plan: RPR+HIV+GC+CT Panel I explained that at this time I see no red flags indicating further evaluation and work-up.  He will keep me informed concerning this especially if he develops lesions or the pain gets worse.  He was comfortable with that.

## 2019-11-06 LAB — RPR+HIV+GC+CT PANEL
Chlamydia trachomatis, NAA: NEGATIVE
HIV Screen 4th Generation wRfx: NONREACTIVE
Neisseria Gonorrhoeae by PCR: NEGATIVE
RPR Ser Ql: NONREACTIVE

## 2019-12-15 DIAGNOSIS — K5100 Ulcerative (chronic) pancolitis without complications: Secondary | ICD-10-CM | POA: Diagnosis not present

## 2020-02-20 DIAGNOSIS — K5100 Ulcerative (chronic) pancolitis without complications: Secondary | ICD-10-CM | POA: Diagnosis not present

## 2020-02-26 ENCOUNTER — Encounter: Payer: Self-pay | Admitting: Family Medicine

## 2020-02-26 ENCOUNTER — Ambulatory Visit: Payer: BC Managed Care – PPO | Admitting: Family Medicine

## 2020-02-26 ENCOUNTER — Other Ambulatory Visit: Payer: Self-pay

## 2020-02-26 VITALS — BP 132/82 | HR 104 | Temp 98.0°F | Ht 70.0 in | Wt 165.0 lb

## 2020-02-26 DIAGNOSIS — N529 Male erectile dysfunction, unspecified: Secondary | ICD-10-CM

## 2020-02-26 DIAGNOSIS — M79672 Pain in left foot: Secondary | ICD-10-CM | POA: Diagnosis not present

## 2020-02-26 DIAGNOSIS — M79671 Pain in right foot: Secondary | ICD-10-CM | POA: Diagnosis not present

## 2020-02-26 MED ORDER — SILDENAFIL CITRATE 20 MG PO TABS: ORAL_TABLET | ORAL | 5 refills | Status: DC

## 2020-02-26 NOTE — Progress Notes (Signed)
   Subjective:    Patient ID: Adrian Mcneil, male    DOB: 18-Aug-1965, 54 y.o.   MRN: [id number]  HPI He complains of bilateral heel pain that has been going on for an undetermined period of time.  It seems to be intermittent in nature and does tend to bother him more when he walks without wearing shoes.  He also has a previous history of shinsplints.  He has been doing some minor physical therapy in terms of range of motion to help with the same.  He states that it does not necessarily bother him early in the morning and there is no particular area of his heel. He would also like a refill on his sildenafil.   Review of Systems     Objective:   Physical Exam Exam of his heel shows no tenderness over the Achilles tendon.  Negative Homans' sign.  Negative compression test.  Retrocalcaneal area was nontender.  No tenderness over the calcaneal spur.  No tenderness over the medial or lateral calcaneal area.      Assessment & Plan:  Pain of both heels  Erectile dysfunction, unspecified erectile dysfunction type - Plan: sildenafil (REVATIO) 20 MG tablet I explained that at this time his symptoms are not specific for Achilles tendon, retrocalcaneal or plantar fasciitis.  Recommend heel cord stretching and range of motion exercises.  Also discussed the use of heel pads and if no improvement will eventually refer to podiatry for possible orthotics.  He was comfortable with that.

## 2020-02-26 NOTE — Patient Instructions (Signed)
Before you get out of bed in the morning do some stretching range of motion stretching.  Get some heel cups.  Always wear shoes.  If that does not work you can try the good feet place to see if that can help and if not then let me know we will refer you on

## 2020-06-01 DIAGNOSIS — L281 Prurigo nodularis: Secondary | ICD-10-CM | POA: Diagnosis not present

## 2020-06-01 DIAGNOSIS — L821 Other seborrheic keratosis: Secondary | ICD-10-CM | POA: Diagnosis not present

## 2020-06-01 DIAGNOSIS — Z85828 Personal history of other malignant neoplasm of skin: Secondary | ICD-10-CM | POA: Diagnosis not present

## 2020-06-01 DIAGNOSIS — L738 Other specified follicular disorders: Secondary | ICD-10-CM | POA: Diagnosis not present

## 2020-06-02 ENCOUNTER — Other Ambulatory Visit: Payer: Self-pay

## 2020-06-02 ENCOUNTER — Ambulatory Visit (INDEPENDENT_AMBULATORY_CARE_PROVIDER_SITE_OTHER): Payer: BC Managed Care – PPO | Admitting: Family Medicine

## 2020-06-02 VITALS — HR 100 | Temp 97.2°F | Ht 69.5 in | Wt 163.8 lb

## 2020-06-02 DIAGNOSIS — Z8619 Personal history of other infectious and parasitic diseases: Secondary | ICD-10-CM

## 2020-06-02 DIAGNOSIS — Z125 Encounter for screening for malignant neoplasm of prostate: Secondary | ICD-10-CM

## 2020-06-02 DIAGNOSIS — Z209 Contact with and (suspected) exposure to unspecified communicable disease: Secondary | ICD-10-CM | POA: Diagnosis not present

## 2020-06-02 DIAGNOSIS — E785 Hyperlipidemia, unspecified: Secondary | ICD-10-CM | POA: Diagnosis not present

## 2020-06-02 DIAGNOSIS — Z8249 Family history of ischemic heart disease and other diseases of the circulatory system: Secondary | ICD-10-CM | POA: Diagnosis not present

## 2020-06-02 DIAGNOSIS — K219 Gastro-esophageal reflux disease without esophagitis: Secondary | ICD-10-CM

## 2020-06-02 DIAGNOSIS — N529 Male erectile dysfunction, unspecified: Secondary | ICD-10-CM | POA: Diagnosis not present

## 2020-06-02 DIAGNOSIS — K5120 Ulcerative (chronic) proctitis without complications: Secondary | ICD-10-CM | POA: Diagnosis not present

## 2020-06-02 DIAGNOSIS — Z23 Encounter for immunization: Secondary | ICD-10-CM | POA: Diagnosis not present

## 2020-06-02 DIAGNOSIS — J301 Allergic rhinitis due to pollen: Secondary | ICD-10-CM

## 2020-06-02 DIAGNOSIS — Z1159 Encounter for screening for other viral diseases: Secondary | ICD-10-CM | POA: Diagnosis not present

## 2020-06-02 DIAGNOSIS — Z0000 Encounter for general adult medical examination without abnormal findings: Secondary | ICD-10-CM

## 2020-06-02 LAB — LIPID PANEL

## 2020-06-02 NOTE — Progress Notes (Addendum)
   Subjective:    Patient ID: Adrian Mcneil, male    DOB: [birth year], 54 y.o.   MRN: [id number]  HPI He is here for complete examination.  His mother died within the last several months of pancreatic cancer.  He would like to be checked to ensure that he does not have any evidence of any kind of cancer.  He does have a family history of heart disease and is not on a prescribed statin but is taking co-Q10 and red yeast rice.  He apparently had difficulty with Lipitor.  He does have ulcerative colitis and has had a colonoscopy.  Presently he is on Apriso and is doing well on that.  He does use sildenafil for  ED.  His allergies seem to be under good control.  He does occasionally have difficulty with reflux and has not had to use Valtrex in quite some time for his herpes.  He is sexually active and would like testing done.  Otherwise family and social history as well as health maintenance and immunizations was reviewed   Review of Systems  All other systems reviewed and are negative.      Objective:   Physical Exam Alert and in no distress. Tympanic membranes and canals are normal. Pharyngeal area is normal. Neck is supple without adenopathy or thyromegaly. Cardiac exam shows a regular sinus rhythm without murmurs or gallops. Lungs are clear to auscultation.       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[place]) - Plan: Comprehensive metabolic panel, CBC with Differential/Platelet  Hyperlipidemia with target LDL less than 100 - Plan: Lipid panel  Erectile dysfunction, unspecified erectile dysfunction type  Gastroesophageal reflux disease without esophagitis  Seasonal  allergic rhinitis due to pollen  History of herpes labialis  Need for Tdap vaccination - Plan: Tdap vaccine greater than or equal to 7yo IM  Contact with or exposure to communicable disease - Plan: RPR+HIV+GC+CT Panel  Need for hepatitis C screening test - Plan: Hepatitis C antibody  Screening for prostate cancer - Plan: PSA  Discussed the fact that he will most likely be placed on a statin but will wait for the blood work.  He continues on his UC medication.  Call if he has problems with esophagitis.  Treat allergies on an as-needed basis.  I will renew Valtrex when he needs it.  Renew sildenafil as needed. Discussed PSA testing with him and we will check the PSA at his request.    11/4 his LDL is over 170.  I will place him on Crestor.  Discussed cardiac calcium scoring and I will order that.

## 2020-06-03 MED ORDER — ROSUVASTATIN CALCIUM 20 MG PO TABS: 20.0000 mg | ORAL_TABLET | Freq: Every day | ORAL | 3 refills | Status: DC

## 2020-06-03 NOTE — Addendum Note (Signed)
Addended by: Denita Lung on: 06/03/2020 09:22 AM   Modules accepted: Orders

## 2020-06-04 LAB — CBC WITH DIFFERENTIAL/PLATELET
Basophils Absolute: 0.1 10*3/uL (ref 0.0–0.2)
Basos: 1 %
EOS (ABSOLUTE): 0.1 10*3/uL (ref 0.0–0.4)
Eos: 1 %
Hematocrit: 45 % (ref 37.5–51.0)
Hemoglobin: 15.5 g/dL (ref 13.0–17.7)
Immature Grans (Abs): 0 10*3/uL (ref 0.0–0.1)
Immature Granulocytes: 0 %
Lymphocytes Absolute: 1.5 10*3/uL (ref 0.7–3.1)
Lymphs: 20 %
MCH: 31 pg (ref 26.6–33.0)
MCHC: 34.4 g/dL (ref 31.5–35.7)
MCV: 90 fL (ref 79–97)
Monocytes Absolute: 0.5 10*3/uL (ref 0.1–0.9)
Monocytes: 7 %
Neutrophils Absolute: 5.4 10*3/uL (ref 1.4–7.0)
Neutrophils: 71 %
Platelets: 296 10*3/uL (ref 150–450)
RBC: 5 x10E6/uL (ref 4.14–5.80)
RDW: 12.5 % (ref 11.6–15.4)
WBC: 7.6 10*3/uL (ref 3.4–10.8)

## 2020-06-04 LAB — RPR+HIV+GC+CT PANEL
Chlamydia trachomatis, NAA: NEGATIVE
HIV Screen 4th Generation wRfx: NONREACTIVE
Neisseria Gonorrhoeae by PCR: NEGATIVE
RPR Ser Ql: NONREACTIVE

## 2020-06-04 LAB — COMPREHENSIVE METABOLIC PANEL
ALT: 16 IU/L (ref 0–44)
AST: 20 IU/L (ref 0–40)
Albumin/Globulin Ratio: 1.6 (ref 1.2–2.2)
Albumin: 4.7 g/dL (ref 3.8–4.9)
Alkaline Phosphatase: 90 IU/L (ref 44–121)
BUN/Creatinine Ratio: 14 (ref 9–20)
BUN: 17 mg/dL (ref 6–24)
Bilirubin Total: 0.4 mg/dL (ref 0.0–1.2)
CO2: 26 mmol/L (ref 20–29)
Calcium: 9.5 mg/dL (ref 8.7–10.2)
Chloride: 99 mmol/L (ref 96–106)
Creatinine, Ser: 1.19 mg/dL (ref 0.76–1.27)
GFR calc Af Amer: 80 mL/min/{1.73_m2} (ref >59)
GFR calc non Af Amer: 69 mL/min/{1.73_m2} (ref >59)
Globulin, Total: 3 g/dL (ref 1.5–4.5)
Glucose: 86 mg/dL (ref 65–99)
Potassium: 5 mmol/L (ref 3.5–5.2)
Sodium: 138 mmol/L (ref 134–144)
Total Protein: 7.7 g/dL (ref 6.0–8.5)

## 2020-06-04 LAB — HEPATITIS C ANTIBODY: Hep C Virus Ab: 0.1 s/co ratio (ref 0.0–0.9)

## 2020-06-04 LAB — PSA: Prostate Specific Ag, Serum: 0.7 ng/mL (ref 0.0–4.0)

## 2020-06-04 LAB — LIPID PANEL
Chol/HDL Ratio: 4.9 ratio (ref 0.0–5.0)
Cholesterol, Total: 243 mg/dL — ABNORMAL HIGH (ref 100–199)
HDL: 50 mg/dL (ref >39)
LDL Chol Calc (NIH): 173 mg/dL — ABNORMAL HIGH (ref 0–99)
Triglycerides: 112 mg/dL (ref 0–149)
VLDL Cholesterol Cal: 20 mg/dL (ref 5–40)

## 2020-06-23 DIAGNOSIS — Z800 Family history of malignant neoplasm of digestive organs: Secondary | ICD-10-CM | POA: Diagnosis not present

## 2020-06-23 DIAGNOSIS — K5100 Ulcerative (chronic) pancolitis without complications: Secondary | ICD-10-CM | POA: Diagnosis not present

## 2020-08-31 ENCOUNTER — Encounter: Payer: Self-pay | Admitting: Family Medicine

## 2020-08-31 ENCOUNTER — Telehealth (INDEPENDENT_AMBULATORY_CARE_PROVIDER_SITE_OTHER): Payer: BC Managed Care – PPO | Admitting: Family Medicine

## 2020-08-31 VITALS — Temp 97.0°F | Wt 163.0 lb

## 2020-08-31 DIAGNOSIS — Z6379 Other stressful life events affecting family and household: Secondary | ICD-10-CM

## 2020-08-31 DIAGNOSIS — F4329 Adjustment disorder with other symptoms: Secondary | ICD-10-CM

## 2020-08-31 NOTE — Progress Notes (Signed)
   Subjective:    Patient ID: Adrian Mcneil, male    DOB: [birth year], 55 y.o.   MRN: [id number]  HPI I connected with  Adrian Mcneil on 08/31/20 by a video enabled telemedicine application and verified that I am speaking with the correct person using two identifiers.  Caregility used.  Me: Office.  Patient at home. I discussed the limitations of evaluation and management by telemedicine. The patient expressed understanding and agreed to proceed. Today's visit is to discuss possible short-term disability.  He has been dealing with a lot of family issues revolving around trying to help take care of his stepfather.  His stepmother has lymphoma and his dad is a caregiver.  He feels the need to help there as much as he can as well.  Also apparently his father's brother died recently which also complicates the issue.  He admits to not dealing fully with the death of his mother from pancreatic cancer from 2 years ago.  He has noted that he is now pulling on his hair and also picking at his skin which has some quite concerned over the stress that he is under.  He has talked this over with HR and his set up to do a televisit with a therapist.  He admits to being a pleaser.   Review of Systems     Objective:   Physical Exam Alert and in no distress and tearful at times.       Assessment & Plan:  Stress due to illness of family member  Stress and adjustment reaction I discussed these issues with him in detail.  He will try and work with his father to find options for his father in terms of possibly senior living or assisted living to help lessen the burden on him.  He will also talk with his father concerning him possibly getting involved in hospice as well as getting help from the mother's oncologist concerning counseling. Encouraged him to get involved with hospice himself to help deal with bereavement.  He will continue them counseling with his therapist. Form will be filled out for 1  month to help get all his squared away.  He is to set up a virtual visit with me in 2 weeks to discuss this in detail. 35 minutes spent counseling.

## 2020-09-06 ENCOUNTER — Telehealth: Payer: Self-pay | Admitting: Family Medicine

## 2020-09-06 NOTE — Telephone Encounter (Signed)
Pt emailed another form over states [employer] is requesting some more information  Put in your forlder to be filled out

## 2020-09-07 ENCOUNTER — Telehealth: Payer: Self-pay

## 2020-09-07 NOTE — Telephone Encounter (Signed)
2nd Form for [employer] completed and faxed and emailed to pt.  Pt informed

## 2020-09-11 NOTE — Telephone Encounter (Signed)
done

## 2020-09-21 ENCOUNTER — Encounter: Payer: Self-pay | Admitting: Family Medicine

## 2020-09-21 NOTE — Progress Notes (Signed)
He is now involved in counseling and has had 2 sessions.  Things are going well there.  He has talked with his various relatives and seems to be getting things worked out there.  The therapist is apparently placed him on Prozac.  He states that he is now ready to go back to work and will continue to work on the above issues and also just life challenges in general.

## 2020-09-23 ENCOUNTER — Telehealth: Payer: Self-pay

## 2020-09-23 NOTE — Telephone Encounter (Signed)
Recv'd return to work form via [employer], this was completed and emailed back to patient

## 2021-01-04 ENCOUNTER — Other Ambulatory Visit: Payer: Self-pay | Admitting: Family Medicine

## 2021-01-05 NOTE — Telephone Encounter (Signed)
Ok to refill 

## 2021-01-26 ENCOUNTER — Other Ambulatory Visit: Payer: Self-pay

## 2021-01-26 MED ORDER — TRIAMCINOLONE ACETONIDE 0.5 % EX CREA
1.0000 | TOPICAL_CREAM | Freq: Three times a day (TID) | CUTANEOUS | 0 refills | Status: DC
Start: 2021-01-26 — End: 2022-04-19

## 2021-01-26 NOTE — Telephone Encounter (Signed)
LVM  for pt advising that med was sent in per Dr. Redmond School. [place]

## 2021-02-18 ENCOUNTER — Other Ambulatory Visit: Payer: Self-pay | Admitting: Family Medicine

## 2021-02-18 DIAGNOSIS — N529 Male erectile dysfunction, unspecified: Secondary | ICD-10-CM

## 2021-02-18 MED ORDER — SILDENAFIL CITRATE 20 MG PO TABS: ORAL_TABLET | ORAL | 5 refills | Status: DC

## 2021-04-26 ENCOUNTER — Other Ambulatory Visit: Payer: Self-pay | Admitting: Family Medicine

## 2021-04-26 DIAGNOSIS — L739 Follicular disorder, unspecified: Secondary | ICD-10-CM

## 2021-04-26 MED ORDER — CLINDAMYCIN PHOSPHATE 1 % EX GEL: Freq: Every day | CUTANEOUS | 5 refills | Status: AC

## 2021-05-23 DIAGNOSIS — R070 Pain in throat: Secondary | ICD-10-CM | POA: Diagnosis not present

## 2021-05-23 DIAGNOSIS — K219 Gastro-esophageal reflux disease without esophagitis: Secondary | ICD-10-CM | POA: Diagnosis not present

## 2021-06-02 DIAGNOSIS — L821 Other seborrheic keratosis: Secondary | ICD-10-CM | POA: Diagnosis not present

## 2021-06-02 DIAGNOSIS — L820 Inflamed seborrheic keratosis: Secondary | ICD-10-CM | POA: Diagnosis not present

## 2021-06-02 DIAGNOSIS — L281 Prurigo nodularis: Secondary | ICD-10-CM | POA: Diagnosis not present

## 2021-06-02 DIAGNOSIS — L814 Other melanin hyperpigmentation: Secondary | ICD-10-CM | POA: Diagnosis not present

## 2021-06-02 DIAGNOSIS — Z85828 Personal history of other malignant neoplasm of skin: Secondary | ICD-10-CM | POA: Diagnosis not present

## 2021-06-02 DIAGNOSIS — D485 Neoplasm of uncertain behavior of skin: Secondary | ICD-10-CM | POA: Diagnosis not present

## 2021-06-07 ENCOUNTER — Ambulatory Visit (INDEPENDENT_AMBULATORY_CARE_PROVIDER_SITE_OTHER): Payer: BC Managed Care – PPO | Admitting: Family Medicine

## 2021-06-07 ENCOUNTER — Other Ambulatory Visit: Payer: Self-pay | Admitting: Family Medicine

## 2021-06-07 ENCOUNTER — Other Ambulatory Visit: Payer: Self-pay

## 2021-06-07 VITALS — BP 126/86 | HR 91 | Temp 98.2°F | Ht 69.0 in | Wt 168.2 lb

## 2021-06-07 DIAGNOSIS — Z8249 Family history of ischemic heart disease and other diseases of the circulatory system: Secondary | ICD-10-CM

## 2021-06-07 DIAGNOSIS — K5120 Ulcerative (chronic) proctitis without complications: Secondary | ICD-10-CM

## 2021-06-07 DIAGNOSIS — K219 Gastro-esophageal reflux disease without esophagitis: Secondary | ICD-10-CM

## 2021-06-07 DIAGNOSIS — Z209 Contact with and (suspected) exposure to unspecified communicable disease: Secondary | ICD-10-CM

## 2021-06-07 DIAGNOSIS — J0100 Acute maxillary sinusitis, unspecified: Secondary | ICD-10-CM

## 2021-06-07 DIAGNOSIS — Z0000 Encounter for general adult medical examination without abnormal findings: Secondary | ICD-10-CM | POA: Diagnosis not present

## 2021-06-07 DIAGNOSIS — Z23 Encounter for immunization: Secondary | ICD-10-CM

## 2021-06-07 DIAGNOSIS — E785 Hyperlipidemia, unspecified: Secondary | ICD-10-CM | POA: Diagnosis not present

## 2021-06-07 DIAGNOSIS — N529 Male erectile dysfunction, unspecified: Secondary | ICD-10-CM

## 2021-06-07 DIAGNOSIS — Z8619 Personal history of other infectious and parasitic diseases: Secondary | ICD-10-CM

## 2021-06-07 DIAGNOSIS — J301 Allergic rhinitis due to pollen: Secondary | ICD-10-CM

## 2021-06-07 MED ORDER — AMOXICILLIN-POT CLAVULANATE 875-125 MG PO TABS: 1.0000 | ORAL_TABLET | Freq: Two times a day (BID) | ORAL | 0 refills | Status: DC

## 2021-06-07 MED ORDER — ROSUVASTATIN CALCIUM 20 MG PO TABS
20.0000 mg | ORAL_TABLET | Freq: Every day | ORAL | 3 refills | Status: DC
Start: 2021-06-07 — End: 2022-07-19

## 2021-06-07 NOTE — Progress Notes (Signed)
   Subjective:    Patient ID: Adrian Mcneil, male    DOB: 11-26-1965, 55 y.o.   MRN: [id number]  HPI He is here for complete examination.  He does have a 2-week history of difficulty with nasal congestion, PND, dry cough but no fever, chills, sore throat, earache or sinus pressure.  He has been using OTC meds for this.  He continues to be followed by GI for his ulcerative colitis and seems to have this under pretty good control.  His reflux symptoms seem to be under good control as well.  He does have underlying allergies that usually bother him in spring and the fall.  He would like to be STD tested.  He has had 1 monkey pox vaccine.  Sildenafil is working well for his ADD.  He does use Valtrex on an as-needed basis.  Continues on Crestor without difficulty.  Does have a family history of heart disease and is interested in having the cardiac calcium score done.  Work and home life seem to be fairly stable.   Review of Systems     Objective:   Physical Exam Alert and in no distress. Tympanic membranes and canals are normal. Pharyngeal area is normal. Neck is supple without adenopathy or thyromegaly. Cardiac exam shows a regular sinus rhythm without murmurs or gallops. Lungs are clear to auscultation.  Abdominal exam shows no masses or tenderness with normal bowel sounds.        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 (HCC)  Hyperlipidemia with target LDL less than 100 - Plan: Lipid panel, rosuvastatin (CRESTOR) 20 MG tablet  Gastroesophageal reflux disease without esophagitis  Seasonal allergic  rhinitis due to pollen  Contact with or exposure to communicable disease - Plan: RPR+HIV+GC+CT Panel  History of herpes labialis  Acute maxillary sinusitis, recurrence not specified - Plan: amoxicillin-clavulanate (AUGMENTIN) 875-125 MG tablet  Need for influenza vaccination - Plan: Flu Vaccine QUAD 6+ mos PF IM (Fluarix Quad PF)  Immunization, viral disease - Plan: Pension scheme manager I explained that the cardiac calcium score can be done but it would be paid for by himself and he is willing to do this. He is to call me when he finishes the antibiotic if not totally back to normal from a sinus infection. Continue to see GI for his ulcerative colitis. Sildenafil on an as-needed basis. Call when he needs a refill on his Valtrex. Treat allergies with OTC medications.

## 2021-06-09 LAB — COMPREHENSIVE METABOLIC PANEL
ALT: 23 IU/L (ref 0–44)
AST: 23 IU/L (ref 0–40)
Albumin/Globulin Ratio: 1.5 (ref 1.2–2.2)
Albumin: 4.7 g/dL (ref 3.8–4.9)
Alkaline Phosphatase: 121 IU/L (ref 44–121)
BUN/Creatinine Ratio: 15 (ref 9–20)
BUN: 15 mg/dL (ref 6–24)
Bilirubin Total: 0.6 mg/dL (ref 0.0–1.2)
CO2: 25 mmol/L (ref 20–29)
Calcium: 9.6 mg/dL (ref 8.7–10.2)
Chloride: 99 mmol/L (ref 96–106)
Creatinine, Ser: 1.03 mg/dL (ref 0.76–1.27)
Globulin, Total: 3.1 g/dL (ref 1.5–4.5)
Glucose: 92 mg/dL (ref 70–99)
Potassium: 4.7 mmol/L (ref 3.5–5.2)
Sodium: 139 mmol/L (ref 134–144)
Total Protein: 7.8 g/dL (ref 6.0–8.5)
eGFR: 86 mL/min/{1.73_m2} (ref >59)

## 2021-06-09 LAB — CBC WITH DIFFERENTIAL/PLATELET
Basophils Absolute: 0.1 10*3/uL (ref 0.0–0.2)
Basos: 1 %
EOS (ABSOLUTE): 0.3 10*3/uL (ref 0.0–0.4)
Eos: 3 %
Hematocrit: 43.3 % (ref 37.5–51.0)
Hemoglobin: 14.6 g/dL (ref 13.0–17.7)
Immature Grans (Abs): 0 10*3/uL (ref 0.0–0.1)
Immature Granulocytes: 0 %
Lymphocytes Absolute: 1.7 10*3/uL (ref 0.7–3.1)
Lymphs: 22 %
MCH: 30.5 pg (ref 26.6–33.0)
MCHC: 33.7 g/dL (ref 31.5–35.7)
MCV: 91 fL (ref 79–97)
Monocytes Absolute: 0.7 10*3/uL (ref 0.1–0.9)
Monocytes: 9 %
Neutrophils Absolute: 4.9 10*3/uL (ref 1.4–7.0)
Neutrophils: 65 %
Platelets: 271 10*3/uL (ref 150–450)
RBC: 4.78 x10E6/uL (ref 4.14–5.80)
RDW: 11.9 % (ref 11.6–15.4)
WBC: 7.6 10*3/uL (ref 3.4–10.8)

## 2021-06-09 LAB — RPR+HIV+GC+CT PANEL
Chlamydia trachomatis, NAA: NEGATIVE
HIV Screen 4th Generation wRfx: NONREACTIVE
Neisseria Gonorrhoeae by PCR: NEGATIVE
RPR Ser Ql: NONREACTIVE

## 2021-06-09 LAB — LIPID PANEL
Chol/HDL Ratio: 3.4 ratio (ref 0.0–5.0)
Cholesterol, Total: 152 mg/dL (ref 100–199)
HDL: 45 mg/dL (ref >39)
LDL Chol Calc (NIH): 93 mg/dL (ref 0–99)
Triglycerides: 71 mg/dL (ref 0–149)
VLDL Cholesterol Cal: 14 mg/dL (ref 5–40)

## 2021-06-10 ENCOUNTER — Ambulatory Visit: Payer: BC Managed Care – PPO

## 2021-06-15 ENCOUNTER — Encounter: Payer: Self-pay | Admitting: Family Medicine

## 2021-06-15 DIAGNOSIS — J0100 Acute maxillary sinusitis, unspecified: Secondary | ICD-10-CM

## 2021-06-15 MED ORDER — AMOXICILLIN-POT CLAVULANATE 875-125 MG PO TABS
1.0000 | ORAL_TABLET | Freq: Two times a day (BID) | ORAL | 0 refills | Status: DC
Start: 2021-06-15 — End: 2021-12-20

## 2021-06-22 ENCOUNTER — Ambulatory Visit (HOSPITAL_COMMUNITY): Payer: Self-pay

## 2021-07-01 ENCOUNTER — Ambulatory Visit: Payer: BC Managed Care – PPO

## 2021-07-05 ENCOUNTER — Telehealth: Payer: Self-pay

## 2021-07-05 DIAGNOSIS — J0100 Acute maxillary sinusitis, unspecified: Secondary | ICD-10-CM

## 2021-07-05 MED ORDER — LEVOFLOXACIN 500 MG PO TABS: 500.0000 mg | ORAL_TABLET | Freq: Every day | ORAL | 0 refills | Status: AC

## 2021-07-05 MED ORDER — PREDNISONE 10 MG (48) PO TBPK: ORAL_TABLET | ORAL | 0 refills | Status: DC

## 2021-07-05 NOTE — Telephone Encounter (Signed)
He has had 2 rounds of Augmentin and still having difficulty with sinus symptoms of nasal congestion, postnasal drainage and malaise.  I will call in [place] and a Sterapred Dosepak and if still having difficulty, he will need to come in for blood work and probably set up for CT scan of sinuses.

## 2021-07-05 NOTE — Telephone Encounter (Signed)
Pt. Called still has the same symptoms going on for about 1 month now. He still has a lot of drainage, sinus pressure, runny nose, slight cough and not feeling well.

## 2021-07-19 ENCOUNTER — Encounter: Payer: Self-pay | Admitting: Family Medicine

## 2021-07-20 DIAGNOSIS — K219 Gastro-esophageal reflux disease without esophagitis: Secondary | ICD-10-CM | POA: Diagnosis not present

## 2021-07-20 DIAGNOSIS — K5100 Ulcerative (chronic) pancolitis without complications: Secondary | ICD-10-CM | POA: Diagnosis not present

## 2021-07-21 DIAGNOSIS — K219 Gastro-esophageal reflux disease without esophagitis: Secondary | ICD-10-CM | POA: Diagnosis not present

## 2021-07-21 DIAGNOSIS — R070 Pain in throat: Secondary | ICD-10-CM | POA: Diagnosis not present

## 2021-07-26 ENCOUNTER — Other Ambulatory Visit: Payer: Self-pay | Admitting: Gastroenterology

## 2021-07-26 DIAGNOSIS — R945 Abnormal results of liver function studies: Secondary | ICD-10-CM | POA: Diagnosis not present

## 2021-07-26 DIAGNOSIS — R7989 Other specified abnormal findings of blood chemistry: Secondary | ICD-10-CM | POA: Diagnosis not present

## 2021-08-11 ENCOUNTER — Ambulatory Visit
Admission: RE | Admit: 2021-08-11 | Discharge: 2021-08-11 | Disposition: A | Discharge status: Home / Self Care | Payer: BC Managed Care – PPO | Source: Ambulatory Visit | Attending: Gastroenterology | Admitting: Gastroenterology

## 2021-08-11 DIAGNOSIS — R7989 Other specified abnormal findings of blood chemistry: Secondary | ICD-10-CM

## 2021-08-19 ENCOUNTER — Ambulatory Visit (INDEPENDENT_AMBULATORY_CARE_PROVIDER_SITE_OTHER): Payer: BC Managed Care – PPO

## 2021-08-19 ENCOUNTER — Other Ambulatory Visit: Payer: Self-pay

## 2021-08-19 DIAGNOSIS — Z23 Encounter for immunization: Secondary | ICD-10-CM

## 2021-11-15 ENCOUNTER — Other Ambulatory Visit: Payer: Self-pay | Admitting: Family Medicine

## 2021-11-15 NOTE — Telephone Encounter (Signed)
[facility] valtrex. Please advise [place] ?

## 2021-11-16 DIAGNOSIS — Z411 Encounter for cosmetic surgery: Secondary | ICD-10-CM | POA: Diagnosis not present

## 2021-11-16 DIAGNOSIS — L570 Actinic keratosis: Secondary | ICD-10-CM | POA: Diagnosis not present

## 2021-11-16 DIAGNOSIS — L821 Other seborrheic keratosis: Secondary | ICD-10-CM | POA: Diagnosis not present

## 2021-11-25 DIAGNOSIS — R945 Abnormal results of liver function studies: Secondary | ICD-10-CM | POA: Diagnosis not present

## 2021-12-20 ENCOUNTER — Encounter: Payer: Self-pay | Admitting: Family Medicine

## 2021-12-20 ENCOUNTER — Ambulatory Visit: Payer: BC Managed Care – PPO | Admitting: Family Medicine

## 2021-12-20 VITALS — BP 120/70 | HR 91 | Temp 97.3°F | Wt 174.2 lb

## 2021-12-20 DIAGNOSIS — Z125 Encounter for screening for malignant neoplasm of prostate: Secondary | ICD-10-CM | POA: Diagnosis not present

## 2021-12-20 DIAGNOSIS — K5120 Ulcerative (chronic) proctitis without complications: Secondary | ICD-10-CM

## 2021-12-20 DIAGNOSIS — E785 Hyperlipidemia, unspecified: Secondary | ICD-10-CM | POA: Diagnosis not present

## 2021-12-20 DIAGNOSIS — K219 Gastro-esophageal reflux disease without esophagitis: Secondary | ICD-10-CM

## 2021-12-20 DIAGNOSIS — Z8249 Family history of ischemic heart disease and other diseases of the circulatory system: Secondary | ICD-10-CM | POA: Diagnosis not present

## 2021-12-20 DIAGNOSIS — Z113 Encounter for screening for infections with a predominantly sexual mode of transmission: Secondary | ICD-10-CM

## 2021-12-20 NOTE — Progress Notes (Signed)
   Subjective:    Patient ID: Adrian Mcneil, male    DOB: Dec 11, 1965, 56 y.o.   MRN: [id number]  HPI He is here for an interval evaluation.  He does have a history of hyperlipidemia and has been on Crestor however he did stop it in March because he was thinking that might be causing his back pain.  Apparently the back pain did go away.  He then stated that he was having some muscle aches and pains as well.  There was also question about elevated liver enzymes and possibly be related to the statin drug.  He does have underlying ulcerative colitis.  He is on mesalamine for that and seems to be tolerating it well.  He also has had difficulty with reflux disease and was using Prilosec.  He has been off of that for at least a month.  He also has had difficulty with allergies mainly in the spring.  He has been intermittently using Nasacort. He also complains of some lesions on the roof of his mouth that he has concerns over.  He also would like to be tested for STDs as well as any other issues related to cancer risk.  Review of Systems     Objective:   Physical Exam Alert and in no distress. Tympanic membranes and canals are normal. Pharyngeal area is normal. Neck is supple without adenopathy or thyromegaly. Cardiac exam shows a regular sinus rhythm without murmurs or gallops. Lungs are clear to auscultation.        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  He will continue to hold the Prilosec since he is not having any reflux symptoms.  He is to use Nasacort on a regular basis and also use an antihistamine on an as-needed basis.  He was comfortable with all this.

## 2021-12-23 DIAGNOSIS — K5100 Ulcerative (chronic) pancolitis without complications: Secondary | ICD-10-CM | POA: Diagnosis not present

## 2021-12-23 DIAGNOSIS — K5289 Other specified noninfective gastroenteritis and colitis: Secondary | ICD-10-CM | POA: Diagnosis not present

## 2021-12-23 DIAGNOSIS — K633 Ulcer of intestine: Secondary | ICD-10-CM | POA: Diagnosis not present

## 2021-12-23 DIAGNOSIS — K529 Noninfective gastroenteritis and colitis, unspecified: Secondary | ICD-10-CM | POA: Diagnosis not present

## 2021-12-23 DIAGNOSIS — K626 Ulcer of anus and rectum: Secondary | ICD-10-CM | POA: Diagnosis not present

## 2021-12-23 DIAGNOSIS — K648 Other hemorrhoids: Secondary | ICD-10-CM | POA: Diagnosis not present

## 2021-12-23 DIAGNOSIS — K6289 Other specified diseases of anus and rectum: Secondary | ICD-10-CM | POA: Diagnosis not present

## 2021-12-23 DIAGNOSIS — K6389 Other specified diseases of intestine: Secondary | ICD-10-CM | POA: Diagnosis not present

## 2021-12-23 LAB — LIPID PANEL
Chol/HDL Ratio: 5.2 ratio — ABNORMAL HIGH (ref 0.0–5.0)
Cholesterol, Total: 220 mg/dL — ABNORMAL HIGH (ref 100–199)
HDL: 42 mg/dL (ref >39)
LDL Chol Calc (NIH): 149 mg/dL — ABNORMAL HIGH (ref 0–99)
Triglycerides: 162 mg/dL — ABNORMAL HIGH (ref 0–149)
VLDL Cholesterol Cal: 29 mg/dL (ref 5–40)

## 2021-12-23 LAB — CBC WITH DIFFERENTIAL/PLATELET
Basophils Absolute: 0.1 10*3/uL (ref 0.0–0.2)
Basos: 1 %
EOS (ABSOLUTE): 0.1 10*3/uL (ref 0.0–0.4)
Eos: 1 %
Hematocrit: 41.8 % (ref 37.5–51.0)
Hemoglobin: 14.1 g/dL (ref 13.0–17.7)
Immature Grans (Abs): 0 10*3/uL (ref 0.0–0.1)
Immature Granulocytes: 0 %
Lymphocytes Absolute: 1.7 10*3/uL (ref 0.7–3.1)
Lymphs: 19 %
MCH: 30.5 pg (ref 26.6–33.0)
MCHC: 33.7 g/dL (ref 31.5–35.7)
MCV: 91 fL (ref 79–97)
Monocytes Absolute: 0.6 10*3/uL (ref 0.1–0.9)
Monocytes: 7 %
Neutrophils Absolute: 6.1 10*3/uL (ref 1.4–7.0)
Neutrophils: 72 %
Platelets: 240 10*3/uL (ref 150–450)
RBC: 4.62 x10E6/uL (ref 4.14–5.80)
RDW: 12.6 % (ref 11.6–15.4)
WBC: 8.6 10*3/uL (ref 3.4–10.8)

## 2021-12-23 LAB — COMPREHENSIVE METABOLIC PANEL
ALT: 11 IU/L (ref 0–44)
AST: 14 IU/L (ref 0–40)
Albumin/Globulin Ratio: 1.5 (ref 1.2–2.2)
Albumin: 4.2 g/dL (ref 3.8–4.9)
Alkaline Phosphatase: 93 IU/L (ref 44–121)
BUN/Creatinine Ratio: 17 (ref 9–20)
BUN: 17 mg/dL (ref 6–24)
Bilirubin Total: 0.4 mg/dL (ref 0.0–1.2)
CO2: 25 mmol/L (ref 20–29)
Calcium: 9.1 mg/dL (ref 8.7–10.2)
Chloride: 102 mmol/L (ref 96–106)
Creatinine, Ser: 1 mg/dL (ref 0.76–1.27)
Globulin, Total: 2.8 g/dL (ref 1.5–4.5)
Glucose: 87 mg/dL (ref 70–99)
Potassium: 4.3 mmol/L (ref 3.5–5.2)
Sodium: 141 mmol/L (ref 134–144)
Total Protein: 7 g/dL (ref 6.0–8.5)
eGFR: 89 mL/min/{1.73_m2} (ref >59)

## 2021-12-23 LAB — RPR+HIV+GC+CT PANEL
Chlamydia trachomatis, NAA: NEGATIVE
HIV Screen 4th Generation wRfx: NONREACTIVE
Neisseria Gonorrhoeae by PCR: NEGATIVE
RPR Ser Ql: NONREACTIVE

## 2021-12-23 LAB — PSA: Prostate Specific Ag, Serum: 0.8 ng/mL (ref 0.0–4.0)

## 2022-01-23 ENCOUNTER — Other Ambulatory Visit: Payer: Self-pay | Admitting: Family Medicine

## 2022-01-23 DIAGNOSIS — N529 Male erectile dysfunction, unspecified: Secondary | ICD-10-CM

## 2022-01-23 NOTE — Telephone Encounter (Signed)
Is this okay to refill? 

## 2022-03-22 ENCOUNTER — Other Ambulatory Visit: Payer: Self-pay | Admitting: Family Medicine

## 2022-03-23 NOTE — Telephone Encounter (Signed)
[facility] to fill pt valtrex. Please advise [facility]

## 2022-04-05 ENCOUNTER — Encounter: Payer: Self-pay | Admitting: Internal Medicine

## 2022-04-07 DIAGNOSIS — K5190 Ulcerative colitis, unspecified, without complications: Secondary | ICD-10-CM | POA: Diagnosis not present

## 2022-04-07 DIAGNOSIS — Z809 Family history of malignant neoplasm, unspecified: Secondary | ICD-10-CM | POA: Diagnosis not present

## 2022-04-07 DIAGNOSIS — Z85828 Personal history of other malignant neoplasm of skin: Secondary | ICD-10-CM | POA: Diagnosis not present

## 2022-04-07 DIAGNOSIS — E785 Hyperlipidemia, unspecified: Secondary | ICD-10-CM | POA: Diagnosis not present

## 2022-04-18 ENCOUNTER — Other Ambulatory Visit: Payer: Self-pay | Admitting: Family Medicine

## 2022-04-18 NOTE — Telephone Encounter (Signed)
[facility] to fill pt Adrian Mcneil cream. Please advise [facility]

## 2022-05-22 ENCOUNTER — Encounter: Payer: Self-pay | Admitting: Internal Medicine

## 2022-06-16 DIAGNOSIS — K5100 Ulcerative (chronic) pancolitis without complications: Secondary | ICD-10-CM | POA: Diagnosis not present

## 2022-06-26 ENCOUNTER — Other Ambulatory Visit: Payer: Self-pay | Admitting: Family Medicine

## 2022-06-26 ENCOUNTER — Encounter: Payer: Self-pay | Admitting: Family Medicine

## 2022-06-26 ENCOUNTER — Ambulatory Visit (INDEPENDENT_AMBULATORY_CARE_PROVIDER_SITE_OTHER): Payer: 59 | Admitting: Family Medicine

## 2022-06-26 VITALS — BP 124/78 | HR 87 | Temp 98.4°F | Ht 70.0 in | Wt 182.6 lb

## 2022-06-26 DIAGNOSIS — Z209 Contact with and (suspected) exposure to unspecified communicable disease: Secondary | ICD-10-CM | POA: Diagnosis not present

## 2022-06-26 DIAGNOSIS — Z0000 Encounter for general adult medical examination without abnormal findings: Secondary | ICD-10-CM

## 2022-06-26 DIAGNOSIS — K5120 Ulcerative (chronic) proctitis without complications: Secondary | ICD-10-CM | POA: Diagnosis not present

## 2022-06-26 DIAGNOSIS — N529 Male erectile dysfunction, unspecified: Secondary | ICD-10-CM

## 2022-06-26 DIAGNOSIS — Z800 Family history of malignant neoplasm of digestive organs: Secondary | ICD-10-CM | POA: Insufficient documentation

## 2022-06-26 DIAGNOSIS — Z23 Encounter for immunization: Secondary | ICD-10-CM

## 2022-06-26 DIAGNOSIS — J301 Allergic rhinitis due to pollen: Secondary | ICD-10-CM | POA: Diagnosis not present

## 2022-06-26 DIAGNOSIS — K219 Gastro-esophageal reflux disease without esophagitis: Secondary | ICD-10-CM

## 2022-06-26 DIAGNOSIS — Z8249 Family history of ischemic heart disease and other diseases of the circulatory system: Secondary | ICD-10-CM

## 2022-06-26 DIAGNOSIS — E785 Hyperlipidemia, unspecified: Secondary | ICD-10-CM

## 2022-06-26 DIAGNOSIS — Z8619 Personal history of other infectious and parasitic diseases: Secondary | ICD-10-CM

## 2022-06-26 MED ORDER — DOXYCYCLINE HYCLATE 100 MG PO TABS: ORAL_TABLET | ORAL | 0 refills | Status: DC

## 2022-06-26 NOTE — Telephone Encounter (Signed)
Need for clarification.

## 2022-06-26 NOTE — Progress Notes (Signed)
I gave him a regular  Complete physical exam  Patient: Adrian Mcneil   DOB: 09-10-1965   56 y.o. Male  MRN: [id number]  Subjective:    Adrian Mcneil is a 56 y.o. male who presents today for a complete physical exam. He reports consuming a general diet. Home exercise routine includes walking 1 hrs per days. He generally feels well. He reports sleeping well. He does have additional problems to discuss today.  He follows up regularly with GI for his underlying ulcerative colitis and GERD.  He is doing well on mesalamine.  He uses sildenafil on an as-needed basis for ED.  He does state that Crestor on a daily basis was causing some myalgias and he has backed off on that and not used any of this in about 10 days.  He also notes that he has occasionally noted waking up with his heart rate feeling like it is racing but he is not been checking the pulse.  Presently he is not taking any meds for his herpes labialis.  His allergies seem to be under good control.  He is sexually active, mainly oral and would like to be STD tested to be safe.  His mother did die of pancreatic cancer.  Wises family and social history as well as health maintenance and immunizations was reviewed   Most recent fall risk assessment:    06/26/2022    9:16 AM  [place] in the past year? 0  Number falls in past yr: 0  Injury with Fall? 0  Risk for fall due to : No Fall Risks  Follow up Falls evaluation completed     Most recent depression screenings:    06/26/2022    9:16 AM 06/07/2021    9:31 AM  PHQ 2/9 Scores  PHQ - 2 Score 0 0        Patient Care Team: Denita Lung, MD as PCP - General (Family Medicine) Sueanne Margarita, MD as PCP - Cardiology (Cardiology)   Outpatient Medications Prior to Visit  Medication Sig   clindamycin (CLINDAGEL) 1 % gel Apply topically daily. as directed   co-enzyme Q-10 30 MG capsule Take 30 mg by mouth daily.   mesalamine (APRISO) 0.375 g 24 hr capsule Take  375 mg by mouth daily.   Multiple Vitamins-Minerals (EMERGEN-C IMMUNE PO) Take by mouth.   Multiple Vitamins-Minerals (EYE VITAMINS PO) Take by mouth.   naproxen sodium (ALEVE) 220 MG tablet Take 220 mg by mouth.   omega-3 acid ethyl esters (LOVAZA) 1 g capsule Take 1 g by mouth daily.   omeprazole (PRILOSEC) 20 MG capsule Take 20 mg by mouth daily.   rosuvastatin (CRESTOR) 20 MG tablet Take 1 tablet (20 mg total) by mouth daily.   sildenafil (REVATIO) 20 MG tablet TAKE UP TO 5 TABLETS BY MOUTH AS NEEDED FOR ERECTION   triamcinolone cream (KENALOG) 0.5 % APPLY 1 APPLICATION TOPICALLY 3 (THREE) TIMES DAILY.   TURMERIC PO Take 1 capsule by mouth.   valACYclovir (VALTREX) 1000 MG tablet TAKE 2 TABLETS BY MOUTH TWICE A DAY   No facility-administered medications prior to visit.    Review of Systems  All other systems reviewed and are negative.         Objective:     BP 124/78   Pulse 87   Temp 98.4 F (36.9 C)   Ht 5' 10"  (1.778 m)   Wt 182 lb 9.6 oz (82.8 kg)   BMI  26.20 kg/m    Physical Exam   Alert and in no distress. Tympanic membranes and canals are normal. Pharyngeal area is normal. Neck is supple without adenopathy or thyromegaly. Cardiac exam shows a regular sinus rhythm without murmurs or gallops. Lungs are clear to auscultation.      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[place])  Erectile dysfunction, unspecified erectile dysfunction type  Family history of heart disease in male family member before age 48  History of herpes labialis  Hyperlipidemia with target LDL less than 100 - Plan: Lipid panel  Contact with or exposure to communicable disease - Plan: RPR+HIV+GC+CT Panel  Family history of pancreatic cancer  Need for influenza vaccination - Plan: Flu Vaccine QUAD 6+  mos PF IM (Fluarix Quad PF)  Immunization History  Administered Date(s) Administered   DT (Pediatric) 10/16/2005   Hepatitis A 09/05/2002, 11/10/2002, 02/04/2003   Hepatitis B 08/06/2002, 09/05/2002, 02/04/2003   Influenza Split 07/24/2011, 06/11/2012   Influenza,inj,Quad PF,6+ Mos 07/27/2014, 03/26/2019, 08/19/2021, 06/26/2022   Influenza-Unspecified 04/30/2020   PFIZER Comirnaty(Gray Top)Covid-19 Tri-Sucrose Vaccine 10/21/2020   PFIZER(Purple Top)SARS-COV-2 Vaccination 10/21/2019, 11/11/2019   Pfizer Covid-19 Vaccine Bivalent Booster 39yr & up 08/19/2021   Tdap 07/31/2005, 06/02/2020   Vaccinia,smallpox Monkeypox Vaccine Live,pf 03/04/2021    Health Maintenance  Topic Date Due   Zoster Vaccines- Shingrix (1 of 2) Never done   COVID-19 Vaccine (5 - 2023-24 season) 03/31/2022   COLONOSCOPY (Pts 45-469yrInsurance coverage will need to be confirmed)  05/09/2026   INFLUENZA VACCINE  Completed   Hepatitis C Screening  Completed   HIV Screening  Completed   HPV VACCINES  Aged Out    He will continue on his present medication regimen.  Did discuss however check his pulse rate the next time he has a rapid heart rate feeling.  He will continue to be followed by GI.  Did recommend that he take the Crestor on an every other day basis to see if this will help with the myalgias that he seems to be having from that.  Continue on sildenafil on an as-needed basis treat the allergies as needed.  Discussed that pancreatic cancer at this point we have no markers available. Problem List Items Addressed This Visit     Allergic rhinitis due to pollen   ED (erectile dysfunction)   Family history of heart disease in male family member before age 117 Family history of pancreatic cancer   Gastroesophageal reflux disease without esophagitis   History of herpes labialis   Hyperlipidemia with target LDL less than 100   Relevant Orders   Lipid panel   Ulcerative colitis ([facility]  Other Visit Diagnoses      Routine general medical examination at a health care facility    -  Primary   Contact with or exposure to communicable disease       Relevant Orders   RPR+HIV+GC+CT Panel   Need for influenza vaccination       Relevant Orders   Flu Vaccine QUAD 6+ mos PF IM (Fluarix Quad PF) (Completed)      No follow-ups on file.     JoJill AlexandersMD

## 2022-06-26 NOTE — Telephone Encounter (Signed)
Please send directions.

## 2022-06-27 ENCOUNTER — Other Ambulatory Visit: Payer: Self-pay | Admitting: Family Medicine

## 2022-06-28 ENCOUNTER — Other Ambulatory Visit: Payer: Self-pay | Admitting: Family Medicine

## 2022-06-28 ENCOUNTER — Other Ambulatory Visit: Payer: Self-pay

## 2022-06-28 LAB — LIPID PANEL
Chol/HDL Ratio: 5.1 ratio — ABNORMAL HIGH (ref 0.0–5.0)
Cholesterol, Total: 247 mg/dL — ABNORMAL HIGH (ref 100–199)
HDL: 48 mg/dL (ref >39)
LDL Chol Calc (NIH): 181 mg/dL — ABNORMAL HIGH (ref 0–99)
Triglycerides: 101 mg/dL (ref 0–149)
VLDL Cholesterol Cal: 18 mg/dL (ref 5–40)

## 2022-06-28 LAB — RPR+HIV+GC+CT PANEL
Chlamydia trachomatis, NAA: NEGATIVE
HIV Screen 4th Generation wRfx: NONREACTIVE
Neisseria Gonorrhoeae by PCR: NEGATIVE
RPR Ser Ql: NONREACTIVE

## 2022-06-28 MED ORDER — DOXYCYCLINE HYCLATE 100 MG PO TABS: ORAL_TABLET | ORAL | 0 refills | Status: DC

## 2022-06-28 NOTE — Telephone Encounter (Signed)
This is NOT a refill-they are asking for directions please.

## 2022-06-28 NOTE — Telephone Encounter (Signed)
Clarification requested by the pharmacy

## 2022-07-19 ENCOUNTER — Other Ambulatory Visit: Payer: Self-pay | Admitting: Family Medicine

## 2022-07-19 DIAGNOSIS — Z8249 Family history of ischemic heart disease and other diseases of the circulatory system: Secondary | ICD-10-CM

## 2022-07-19 DIAGNOSIS — E785 Hyperlipidemia, unspecified: Secondary | ICD-10-CM

## 2022-07-19 MED ORDER — ROSUVASTATIN CALCIUM 20 MG PO TABS: 20.0000 mg | ORAL_TABLET | Freq: Every day | ORAL | 3 refills | Status: AC

## 2022-08-02 DIAGNOSIS — L821 Other seborrheic keratosis: Secondary | ICD-10-CM | POA: Diagnosis not present

## 2022-08-02 DIAGNOSIS — D3617 Benign neoplasm of peripheral nerves and autonomic nervous system of trunk, unspecified: Secondary | ICD-10-CM | POA: Diagnosis not present

## 2022-08-02 DIAGNOSIS — D225 Melanocytic nevi of trunk: Secondary | ICD-10-CM | POA: Diagnosis not present

## 2022-08-02 DIAGNOSIS — Z85828 Personal history of other malignant neoplasm of skin: Secondary | ICD-10-CM | POA: Diagnosis not present

## 2022-08-02 DIAGNOSIS — I788 Other diseases of capillaries: Secondary | ICD-10-CM | POA: Diagnosis not present

## 2022-08-02 DIAGNOSIS — L570 Actinic keratosis: Secondary | ICD-10-CM | POA: Diagnosis not present

## 2022-08-30 DIAGNOSIS — M6240 Contracture of muscle, unspecified site: Secondary | ICD-10-CM | POA: Diagnosis not present

## 2022-08-30 DIAGNOSIS — M5137 Other intervertebral disc degeneration, lumbosacral region: Secondary | ICD-10-CM | POA: Diagnosis not present

## 2022-08-30 DIAGNOSIS — M47816 Spondylosis without myelopathy or radiculopathy, lumbar region: Secondary | ICD-10-CM | POA: Diagnosis not present

## 2022-08-30 DIAGNOSIS — M9903 Segmental and somatic dysfunction of lumbar region: Secondary | ICD-10-CM | POA: Diagnosis not present

## 2022-08-30 DIAGNOSIS — M4057 Lordosis, unspecified, lumbosacral region: Secondary | ICD-10-CM | POA: Diagnosis not present

## 2022-08-30 DIAGNOSIS — M47812 Spondylosis without myelopathy or radiculopathy, cervical region: Secondary | ICD-10-CM | POA: Diagnosis not present

## 2022-08-30 DIAGNOSIS — M9902 Segmental and somatic dysfunction of thoracic region: Secondary | ICD-10-CM | POA: Diagnosis not present

## 2022-08-30 DIAGNOSIS — M9901 Segmental and somatic dysfunction of cervical region: Secondary | ICD-10-CM | POA: Diagnosis not present

## 2022-08-31 ENCOUNTER — Other Ambulatory Visit: Payer: Self-pay | Admitting: Family Medicine

## 2022-08-31 NOTE — Telephone Encounter (Signed)
Is it ok to refill?

## 2022-09-01 DIAGNOSIS — M4057 Lordosis, unspecified, lumbosacral region: Secondary | ICD-10-CM | POA: Diagnosis not present

## 2022-09-01 DIAGNOSIS — M5137 Other intervertebral disc degeneration, lumbosacral region: Secondary | ICD-10-CM | POA: Diagnosis not present

## 2022-09-01 DIAGNOSIS — M47812 Spondylosis without myelopathy or radiculopathy, cervical region: Secondary | ICD-10-CM | POA: Diagnosis not present

## 2022-09-01 DIAGNOSIS — M9902 Segmental and somatic dysfunction of thoracic region: Secondary | ICD-10-CM | POA: Diagnosis not present

## 2022-09-01 DIAGNOSIS — M9901 Segmental and somatic dysfunction of cervical region: Secondary | ICD-10-CM | POA: Diagnosis not present

## 2022-09-01 DIAGNOSIS — M47816 Spondylosis without myelopathy or radiculopathy, lumbar region: Secondary | ICD-10-CM | POA: Diagnosis not present

## 2022-09-01 DIAGNOSIS — M6240 Contracture of muscle, unspecified site: Secondary | ICD-10-CM | POA: Diagnosis not present

## 2022-09-01 DIAGNOSIS — M9903 Segmental and somatic dysfunction of lumbar region: Secondary | ICD-10-CM | POA: Diagnosis not present

## 2022-09-04 DIAGNOSIS — M9901 Segmental and somatic dysfunction of cervical region: Secondary | ICD-10-CM | POA: Diagnosis not present

## 2022-09-04 DIAGNOSIS — M47812 Spondylosis without myelopathy or radiculopathy, cervical region: Secondary | ICD-10-CM | POA: Diagnosis not present

## 2022-09-04 DIAGNOSIS — M5137 Other intervertebral disc degeneration, lumbosacral region: Secondary | ICD-10-CM | POA: Diagnosis not present

## 2022-09-04 DIAGNOSIS — M4057 Lordosis, unspecified, lumbosacral region: Secondary | ICD-10-CM | POA: Diagnosis not present

## 2022-09-04 DIAGNOSIS — M9902 Segmental and somatic dysfunction of thoracic region: Secondary | ICD-10-CM | POA: Diagnosis not present

## 2022-09-04 DIAGNOSIS — M6240 Contracture of muscle, unspecified site: Secondary | ICD-10-CM | POA: Diagnosis not present

## 2022-09-04 DIAGNOSIS — M47816 Spondylosis without myelopathy or radiculopathy, lumbar region: Secondary | ICD-10-CM | POA: Diagnosis not present

## 2022-09-04 DIAGNOSIS — M9903 Segmental and somatic dysfunction of lumbar region: Secondary | ICD-10-CM | POA: Diagnosis not present

## 2022-09-06 DIAGNOSIS — M5137 Other intervertebral disc degeneration, lumbosacral region: Secondary | ICD-10-CM | POA: Diagnosis not present

## 2022-09-06 DIAGNOSIS — M9901 Segmental and somatic dysfunction of cervical region: Secondary | ICD-10-CM | POA: Diagnosis not present

## 2022-09-06 DIAGNOSIS — M6240 Contracture of muscle, unspecified site: Secondary | ICD-10-CM | POA: Diagnosis not present

## 2022-09-06 DIAGNOSIS — M9902 Segmental and somatic dysfunction of thoracic region: Secondary | ICD-10-CM | POA: Diagnosis not present

## 2022-09-06 DIAGNOSIS — M4057 Lordosis, unspecified, lumbosacral region: Secondary | ICD-10-CM | POA: Diagnosis not present

## 2022-09-06 DIAGNOSIS — M9903 Segmental and somatic dysfunction of lumbar region: Secondary | ICD-10-CM | POA: Diagnosis not present

## 2022-09-06 DIAGNOSIS — M47812 Spondylosis without myelopathy or radiculopathy, cervical region: Secondary | ICD-10-CM | POA: Diagnosis not present

## 2022-09-06 DIAGNOSIS — M47816 Spondylosis without myelopathy or radiculopathy, lumbar region: Secondary | ICD-10-CM | POA: Diagnosis not present

## 2022-09-08 DIAGNOSIS — M6240 Contracture of muscle, unspecified site: Secondary | ICD-10-CM | POA: Diagnosis not present

## 2022-09-08 DIAGNOSIS — M4057 Lordosis, unspecified, lumbosacral region: Secondary | ICD-10-CM | POA: Diagnosis not present

## 2022-09-08 DIAGNOSIS — M5137 Other intervertebral disc degeneration, lumbosacral region: Secondary | ICD-10-CM | POA: Diagnosis not present

## 2022-09-08 DIAGNOSIS — M9902 Segmental and somatic dysfunction of thoracic region: Secondary | ICD-10-CM | POA: Diagnosis not present

## 2022-09-08 DIAGNOSIS — M47816 Spondylosis without myelopathy or radiculopathy, lumbar region: Secondary | ICD-10-CM | POA: Diagnosis not present

## 2022-09-08 DIAGNOSIS — M9903 Segmental and somatic dysfunction of lumbar region: Secondary | ICD-10-CM | POA: Diagnosis not present

## 2022-09-08 DIAGNOSIS — M9901 Segmental and somatic dysfunction of cervical region: Secondary | ICD-10-CM | POA: Diagnosis not present

## 2022-09-08 DIAGNOSIS — M47812 Spondylosis without myelopathy or radiculopathy, cervical region: Secondary | ICD-10-CM | POA: Diagnosis not present

## 2022-09-11 DIAGNOSIS — M9903 Segmental and somatic dysfunction of lumbar region: Secondary | ICD-10-CM | POA: Diagnosis not present

## 2022-09-11 DIAGNOSIS — M4057 Lordosis, unspecified, lumbosacral region: Secondary | ICD-10-CM | POA: Diagnosis not present

## 2022-09-11 DIAGNOSIS — M9902 Segmental and somatic dysfunction of thoracic region: Secondary | ICD-10-CM | POA: Diagnosis not present

## 2022-09-11 DIAGNOSIS — M47816 Spondylosis without myelopathy or radiculopathy, lumbar region: Secondary | ICD-10-CM | POA: Diagnosis not present

## 2022-09-11 DIAGNOSIS — M6240 Contracture of muscle, unspecified site: Secondary | ICD-10-CM | POA: Diagnosis not present

## 2022-09-11 DIAGNOSIS — M47812 Spondylosis without myelopathy or radiculopathy, cervical region: Secondary | ICD-10-CM | POA: Diagnosis not present

## 2022-09-11 DIAGNOSIS — M5137 Other intervertebral disc degeneration, lumbosacral region: Secondary | ICD-10-CM | POA: Diagnosis not present

## 2022-09-11 DIAGNOSIS — M9901 Segmental and somatic dysfunction of cervical region: Secondary | ICD-10-CM | POA: Diagnosis not present

## 2022-09-13 DIAGNOSIS — M6240 Contracture of muscle, unspecified site: Secondary | ICD-10-CM | POA: Diagnosis not present

## 2022-09-13 DIAGNOSIS — M5137 Other intervertebral disc degeneration, lumbosacral region: Secondary | ICD-10-CM | POA: Diagnosis not present

## 2022-09-13 DIAGNOSIS — M4057 Lordosis, unspecified, lumbosacral region: Secondary | ICD-10-CM | POA: Diagnosis not present

## 2022-09-13 DIAGNOSIS — M9902 Segmental and somatic dysfunction of thoracic region: Secondary | ICD-10-CM | POA: Diagnosis not present

## 2022-09-13 DIAGNOSIS — M47812 Spondylosis without myelopathy or radiculopathy, cervical region: Secondary | ICD-10-CM | POA: Diagnosis not present

## 2022-09-13 DIAGNOSIS — M47816 Spondylosis without myelopathy or radiculopathy, lumbar region: Secondary | ICD-10-CM | POA: Diagnosis not present

## 2022-09-13 DIAGNOSIS — M9903 Segmental and somatic dysfunction of lumbar region: Secondary | ICD-10-CM | POA: Diagnosis not present

## 2022-09-13 DIAGNOSIS — M9901 Segmental and somatic dysfunction of cervical region: Secondary | ICD-10-CM | POA: Diagnosis not present

## 2022-09-15 DIAGNOSIS — M47812 Spondylosis without myelopathy or radiculopathy, cervical region: Secondary | ICD-10-CM | POA: Diagnosis not present

## 2022-09-15 DIAGNOSIS — M9903 Segmental and somatic dysfunction of lumbar region: Secondary | ICD-10-CM | POA: Diagnosis not present

## 2022-09-15 DIAGNOSIS — M9902 Segmental and somatic dysfunction of thoracic region: Secondary | ICD-10-CM | POA: Diagnosis not present

## 2022-09-15 DIAGNOSIS — M5137 Other intervertebral disc degeneration, lumbosacral region: Secondary | ICD-10-CM | POA: Diagnosis not present

## 2022-09-15 DIAGNOSIS — M4057 Lordosis, unspecified, lumbosacral region: Secondary | ICD-10-CM | POA: Diagnosis not present

## 2022-09-15 DIAGNOSIS — M47816 Spondylosis without myelopathy or radiculopathy, lumbar region: Secondary | ICD-10-CM | POA: Diagnosis not present

## 2022-09-15 DIAGNOSIS — M9901 Segmental and somatic dysfunction of cervical region: Secondary | ICD-10-CM | POA: Diagnosis not present

## 2022-09-20 DIAGNOSIS — M47816 Spondylosis without myelopathy or radiculopathy, lumbar region: Secondary | ICD-10-CM | POA: Diagnosis not present

## 2022-09-20 DIAGNOSIS — M9901 Segmental and somatic dysfunction of cervical region: Secondary | ICD-10-CM | POA: Diagnosis not present

## 2022-09-20 DIAGNOSIS — M9902 Segmental and somatic dysfunction of thoracic region: Secondary | ICD-10-CM | POA: Diagnosis not present

## 2022-09-20 DIAGNOSIS — M4057 Lordosis, unspecified, lumbosacral region: Secondary | ICD-10-CM | POA: Diagnosis not present

## 2022-09-20 DIAGNOSIS — M47812 Spondylosis without myelopathy or radiculopathy, cervical region: Secondary | ICD-10-CM | POA: Diagnosis not present

## 2022-09-20 DIAGNOSIS — M5137 Other intervertebral disc degeneration, lumbosacral region: Secondary | ICD-10-CM | POA: Diagnosis not present

## 2022-09-20 DIAGNOSIS — M9903 Segmental and somatic dysfunction of lumbar region: Secondary | ICD-10-CM | POA: Diagnosis not present

## 2022-09-25 NOTE — Progress Notes (Unsigned)
Cardiology Consult Note    Date:  09/26/2022   ID:  Adrian Mcneil, DOB 03-14-1966, MRN [id number]  PCP:  Denita Lung, MD  Cardiologist:  Fransico Him, MD   Chief Complaint  Patient presents with   New Patient (Initial Visit)    Family history of heart disease and hyperlipidemia    History of Present Illness:  Adrian Mcneil is a 57 y.o. male who is being seen today for the evaluation of a history of CAD at the request of Denita Lung, MD.  This is a very pleasant 57 year old male with a history of hyperlipidemia, GERD and a family history of premature CAD who is referred for cardiac evaluation due to family history.   He says that his father had his first MI at 30 and then subsequent PCI x2.  He was a heavy smoker.  His mom has pancreatic CA.  He does not know of any other family members with CAD at this time.  ing meds with no SE. he was last seen by me in 2019 at which time a coronary calcium score was 0.  Exercise treadmill test was also normal with no ischemia at that time.  He is now referred back by his PCP.  His mom passed in 2020 and has been using ZZZquil to help him sleep and he read that it could interfere with heart issues especially arrhythmias with long term use. He says that now he has been feeling heart pounding especially in the middle of the night.  He says that occasionally he will have the same think that his heart is racing but he will check his apple watch and it is normal.   He denies any chest pain or pressure, SOB, DOE, PND, orthopnea, LE edema, dizziness or syncope. He is compliant with his meds and is tolerating meds with no SE.    Past Medical History:  Diagnosis Date   Acid indigestion    Allergic rhinitis, cause unspecified    seasonal   Cancer ([place])    skin   Dyslipidemia    Erectile dysfunction    FHx: cardiovascular disease    Hyperlipidemia    Ulcerative colitis    Dr. Oletta Lamas    Past Surgical History:  Procedure Laterality  Date   ANTERIOR CRUCIATE LIGAMENT REPAIR Right 08/19/2015   Procedure: RECONSTRUCTION ANTERIOR CRUCIATE LIGAMENT (ACL) WITH HAMSTRING GRAFT, PARTIAL MEDIAL MENISECTOMY.  ;  Surgeon: Meredith Pel, MD;  Location: [place];  Service: Orthopedics;  Laterality: Right;   COLONOSCOPY  2007   Dr. Oletta Lamas    Current Medications: Current Meds  Medication Sig   clindamycin (CLINDAGEL) 1 % gel Apply topically daily. as directed   co-enzyme Q-10 30 MG capsule Take 30 mg by mouth daily.   doxycycline (VIBRA-TABS) 100 MG tablet TAKE 2 TABLETS DAILY AS NEEDED FOR SEXUALLY ACTIVITY   mesalamine (APRISO) 0.375 g 24 hr capsule Take 375 mg by mouth daily.   Multiple Vitamins-Minerals (EMERGEN-C IMMUNE PO) Take by mouth.   Multiple Vitamins-Minerals (EYE VITAMINS PO) Take by mouth.   naproxen sodium (ALEVE) 220 MG tablet Take 220 mg by mouth.   omega-3 acid ethyl esters (LOVAZA) 1 g capsule Take 1 g by mouth daily.   omeprazole (PRILOSEC) 20 MG capsule Take 20 mg by mouth daily.   rosuvastatin (CRESTOR) 20 MG tablet Take 1 tablet (20 mg total) by mouth daily.   sildenafil (REVATIO) 20 MG tablet TAKE UP TO 5 TABLETS BY MOUTH  AS NEEDED FOR ERECTION   triamcinolone cream (KENALOG) 0.5 % APPLY 1 APPLICATION TOPICALLY 3 (THREE) TIMES DAILY.   TURMERIC PO Take 1 capsule by mouth.   valACYclovir (VALTREX) 1000 MG tablet TAKE 2 TABLETS BY MOUTH TWICE A DAY    Allergies:   Patient has no known allergies.   Social History   Socioeconomic History   Marital status: Single    Spouse name: Not on file   Number of children: Not on file   Years of education: Not on file   Highest education level: Not on file  Occupational History   Occupation: Games developer: [employer]  Tobacco Use   Smoking status: Never   Smokeless tobacco: Never  Substance and Sexual Activity   Alcohol use: Yes    Comment: socially, 1-2 drinks once or twice a week   Drug use: No   Sexual activity: Not Currently    Partners:  Male  Other Topics Concern   Not on file  Social History Narrative   Not on file   Social Determinants of Health   Financial Resource Strain: Not on file  Food Insecurity: Not on file  Transportation Needs: Not on file  Physical Activity: Not on file  Stress: Not on file  Social Connections: Not on file     Family History:  The patient's family history includes Cancer in his sister; Heart disease (age of onset: 70) in his father; Hyperlipidemia in his mother.   ROS:   Please see the history of present illness.    ROS All other systems reviewed and are negative.      No data to display             PHYSICAL EXAM:   VS:  BP 120/80   Pulse 90   Ht '5\' 10"'$  (1.778 m)   Wt 182 lb 3.2 oz (82.6 kg)   SpO2 95%   BMI 26.14 kg/m    GEN: Well nourished, well developed in no acute distress HEENT: Normal NECK: No JVD; No carotid bruits LYMPHATICS: No lymphadenopathy CARDIAC:RRR, no murmurs, rubs, gallops RESPIRATORY:  Clear to auscultation without rales, wheezing or rhonchi  ABDOMEN: Soft, non-tender, non-distended MUSCULOSKELETAL:  No edema; No deformity  SKIN: Warm and dry NEUROLOGIC:  Alert and oriented x 3 PSYCHIATRIC:  Normal affect  Wt Readings from Last 3 Encounters:  09/26/22 182 lb 3.2 oz (82.6 kg)  06/26/22 182 lb 9.6 oz (82.8 kg)  12/20/21 174 lb 3.2 oz (79 kg)      Studies/Labs Reviewed:   EKG:  EKG is ordered today and demonstrates NSR with nonspecific T wave abnormality.    Recent Labs: 12/20/2021: ALT 11; BUN 17; Creatinine, Ser 1.00; Hemoglobin 14.1; Platelets 240; Potassium 4.3; Sodium 141   Lipid Panel    Component Value Date/Time   CHOL 247 (H) 06/26/2022 1005   TRIG 101 06/26/2022 1005   HDL 48 06/26/2022 1005   CHOLHDL 5.1 (H) 06/26/2022 1005   CHOLHDL 5.9 07/27/2014 1506   VLDL 49 (H) 07/27/2014 1506   LDLCALC 181 (H) 06/26/2022 1005    Additional studies/ records that were reviewed today include:  Office notes from  PCP    ASSESSMENT:    1. Family history of heart disease in male family member before age 43   2. Hyperlipidemia with target LDL less than 100   3. Palpitations      PLAN:  In order of problems listed above:  1.  Family history  of heart disease - his father had his first MI at 3 and then to PCI subsequent.  He was a heavy smoker.  He does not have a known family history other than his father of CAD.   -Coronary calcium score in 2019 was 0 -He has no anginal symptoms -Since it has been 5 years we will repeat a coronary calcium score as well as as an exercise treadmill test -Shared Decision Making/Informed Consent The risks [chest pain, shortness of breath, cardiac arrhythmias, dizziness, blood pressure fluctuations, myocardial infarction, stroke/transient ischemic attack, and life-threatening complications (estimated to be 1 in 10,000)], benefits (risk stratification, diagnosing coronary artery disease, treatment guidance) and alternatives of an exercise tolerance test were discussed in detail with Adrian Mcneil and he agrees to proceed.  2.  Hyperlipidemia  -He given a prescription for atorvastatin but he has not started that and is taking red yeast rice instead because he is concerned that his mom had allergic reaction to statins. -I have personally reviewed and interpreted outside labs performed by patient's PCP which showed LDL 181 and HDL 48>>he had not been taking his Crestor '20mg'$  daily -his is now taking Crestor '20mg'$  qod and had fullowup with PCP  3.  Palpitations -I will get a 2 week ziopatch to assess for arrhythmias    Medication Adjustments/Labs and Tests Ordered: Current medicines are reviewed at length with the patient today.  Concerns regarding medicines are outlined above.  Medication changes, Labs and Tests ordered today are listed in the Patient Instructions below.  Patient Instructions  Medication Instructions:  Your physician recommends that you continue on  your current medications as directed. Please refer to the Current Medication list given to you today.  *If you need a refill on your cardiac medications before your next appointment, please call your pharmacy*   Testing/Procedures: Your physician has recommended that you wear an event monitor. Event monitors are medical devices that record the heart's electrical activity. Doctors most often [place] these monitors to diagnose arrhythmias. Arrhythmias are problems with the speed or rhythm of the heartbeat. The monitor is a small, portable device. You can wear one while you do your normal daily activities. This is usually used to diagnose what is causing palpitations/syncope (passing out).   Your physician has requested that you have an exercise tolerance test. For further information please visit [email or web address]. Please also follow instruction sheet, as given.   Coronary calcium score   Follow-Up: At [facility], you and your health needs are our priority.  As part of our continuing mission to provide you with exceptional heart care, we have created designated Provider Care Teams.  These Care Teams include your primary Cardiologist (physician) and Advanced Practice Providers (APPs -  Physician Assistants and Nurse Practitioners) who all work together to provide you with the care you need, when you need it.  Your next appointment:   As needed   Provider:   Fransico Him, MD    [place] Monitor Instructions  Your physician has requested you wear a ZIO patch monitor for 14 days.  This is a single patch monitor. [employer] supplies one patch monitor per enrollment. Additional stickers are not available. Please do not apply patch if you will be having a Nuclear Stress Test,  Echocardiogram, Cardiac CT, MRI, or Chest Xray during the period you would be wearing the  monitor. The patch cannot be worn during these tests. You cannot remove and re-apply the  ZIO XT patch monitor.  Your ZIO patch monitor will be mailed 3 day USPS to your address on file. It may take 3-5 days  to receive your monitor after you have been enrolled.  Once you have received your monitor, please review the enclosed instructions. Your monitor  has already been registered assigning a specific monitor serial # to you.  Billing and Patient Assistance Program Information  We have supplied [employer] with any of your insurance information on file for billing purposes. [employer] offers a sliding scale Patient Assistance Program for patients that do not have  insurance, or whose insurance does not completely cover the cost of the ZIO monitor.  You must apply for the Patient Assistance Program to qualify for this discounted rate.  To apply, please call [employer] at [phone], select option 4, select option 2, ask to apply for  Patient Assistance Program. [name] will ask your household income, and how many people  are in your household. They will quote your out-of-pocket cost based on that information.  [employer] will also be able to set up a 63-month interest-free payment plan if needed.  Applying the monitor   Shave hair from upper left chest.  Hold abrader disc by orange tab. Rub abrader in 40 strokes over the upper left chest as  indicated in your monitor instructions.  Clean area with 4 enclosed alcohol pads. Let dry.  Apply patch as indicated in monitor instructions. Patch will be placed under collarbone on left  side of chest with arrow pointing upward.  Rub patch adhesive wings for 2 minutes. Remove white label marked "1". Remove the white  label marked "2". Rub patch adhesive wings for 2 additional minutes.  While looking in a mirror, press and release button in center of patch. A small green light will  flash 3-4 times. This will be your only indicator that the monitor has been turned on.  Do not shower for the first 24 hours. You may shower after the first 24 hours.  Press the button if  you feel a symptom. You will hear a small click. Record Date, Time and  Symptom in the Patient Logbook.  When you are ready to remove the patch, follow instructions on the last 2 pages of Patient  Logbook. Stick patch monitor onto the last page of Patient Logbook.  Place Patient Logbook in the blue and white box. Use locking tab on box and tape box closed  securely. The blue and white box has prepaid postage on it. Please place it in the mailbox as  soon as possible. Your physician should have your test results approximately 7 days after the  [facility]  Call [place]at [phone] you have questions regarding  your ZIO XT patch monitor. Call them immediately if you see an orange light blinking on your  monitor.  If your monitor falls off in less than 4 days, contact our Monitor department at [phone]  If your monitor becomes loose or falls off after 4 days call [employer] at [phone]  suggestions on securing your monitor      Signed, TFransico Him MD  09/26/2022 1:47 PM    [facility] [address] [place]  [id number]: [phone] Fax: [phone]

## 2022-09-26 ENCOUNTER — Ambulatory Visit (INDEPENDENT_AMBULATORY_CARE_PROVIDER_SITE_OTHER): Payer: 59

## 2022-09-26 ENCOUNTER — Encounter: Payer: Self-pay | Admitting: Cardiology

## 2022-09-26 ENCOUNTER — Ambulatory Visit: Payer: 59 | Attending: Cardiology | Admitting: Cardiology

## 2022-09-26 VITALS — BP 120/80 | HR 90 | Ht 70.0 in | Wt 182.2 lb

## 2022-09-26 DIAGNOSIS — E785 Hyperlipidemia, unspecified: Secondary | ICD-10-CM

## 2022-09-26 DIAGNOSIS — R002 Palpitations: Secondary | ICD-10-CM | POA: Diagnosis not present

## 2022-09-26 DIAGNOSIS — Z8249 Family history of ischemic heart disease and other diseases of the circulatory system: Secondary | ICD-10-CM

## 2022-09-26 NOTE — Patient Instructions (Addendum)
Medication Instructions:  Your physician recommends that you continue on your current medications as directed. Please refer to the Current Medication list given to you today.  *If you need a refill on your cardiac medications before your next appointment, please call your pharmacy*   Testing/Procedures: Your physician has recommended that you wear an event monitor. Event monitors are medical devices that record the heart's electrical activity. Doctors most often [place] these monitors to diagnose arrhythmias. Arrhythmias are problems with the speed or rhythm of the heartbeat. The monitor is a small, portable device. You can wear one while you do your normal daily activities. This is usually used to diagnose what is causing palpitations/syncope (passing out).   Your physician has requested that you have an exercise tolerance test. For further information please visit [email or web address]. Please also follow instruction sheet, as given.   Coronary calcium score   Follow-Up: At [facility], you and your health needs are our priority.  As part of our continuing mission to provide you with exceptional heart care, we have created designated Provider Care Teams.  These Care Teams include your primary Cardiologist (physician) and Advanced Practice Providers (APPs -  Physician Assistants and Nurse Practitioners) who all work together to provide you with the care you need, when you need it.  Your next appointment:   As needed   Provider:   Fransico Him, MD    [place] Monitor Instructions  Your physician has requested you wear a ZIO patch monitor for 14 days.  This is a single patch monitor. [employer] supplies one patch monitor per enrollment. Additional stickers are not available. Please do not apply patch if you will be having a Nuclear Stress Test,  Echocardiogram, Cardiac CT, MRI, or Chest Xray during the period you would be wearing the  monitor. The patch cannot be worn during  these tests. You cannot remove and re-apply the  ZIO XT patch monitor.  Your ZIO patch monitor will be mailed 3 day USPS to your address on file. It may take 3-5 days  to receive your monitor after you have been enrolled.  Once you have received your monitor, please review the enclosed instructions. Your monitor  has already been registered assigning a specific monitor serial # to you.  Billing and Patient Assistance Program Information  We have supplied [employer] with any of your insurance information on file for billing purposes. [employer] offers a sliding scale Patient Assistance Program for patients that do not have  insurance, or whose insurance does not completely cover the cost of the ZIO monitor.  You must apply for the Patient Assistance Program to qualify for this discounted rate.  To apply, please call [employer] at [phone], select option 4, select option 2, ask to apply for  Patient Assistance Program. [name] will ask your household income, and how many people  are in your household. They will quote your out-of-pocket cost based on that information.  [employer] will also be able to set up a 54-month interest-free payment plan if needed.  Applying the monitor   Shave hair from upper left chest.  Hold abrader disc by orange tab. Rub abrader in 40 strokes over the upper left chest as  indicated in your monitor instructions.  Clean area with 4 enclosed alcohol pads. Let dry.  Apply patch as indicated in monitor instructions. Patch will be placed under collarbone on left  side of chest with arrow pointing upward.  Rub patch adhesive wings for 2 minutes. Remove white  label marked "1". Remove the white  label marked "2". Rub patch adhesive wings for 2 additional minutes.  While looking in a mirror, press and release button in center of patch. A small green light will  flash 3-4 times. This will be your only indicator that the monitor has been turned on.  Do not shower for the first 24  hours. You may shower after the first 24 hours.  Press the button if you feel a symptom. You will hear a small click. Record Date, Time and  Symptom in the Patient Logbook.  When you are ready to remove the patch, follow instructions on the last 2 pages of Patient  Logbook. Stick patch monitor onto the last page of Patient Logbook.  Place Patient Logbook in the blue and white box. Use locking tab on box and tape box closed  securely. The blue and white box has prepaid postage on it. Please place it in the mailbox as  soon as possible. Your physician should have your test results approximately 7 days after the  monitor has been mailed back to [facility].  Call [place] at [phone] if you have questions regarding  your ZIO XT patch monitor. Call them immediately if you see an orange light blinking on your  monitor.  If your monitor falls off in less than 4 days, contact our Monitor department at [phone].  If your monitor becomes loose or falls off after 4 days call [employer] at [phone] for  suggestions on securing your monitor

## 2022-09-26 NOTE — Progress Notes (Unsigned)
Enrolled patient for a 14 day Zio XT  monitor to be mailed to patients home  °

## 2022-09-27 DIAGNOSIS — M9901 Segmental and somatic dysfunction of cervical region: Secondary | ICD-10-CM | POA: Diagnosis not present

## 2022-09-27 DIAGNOSIS — M9903 Segmental and somatic dysfunction of lumbar region: Secondary | ICD-10-CM | POA: Diagnosis not present

## 2022-09-27 DIAGNOSIS — M9902 Segmental and somatic dysfunction of thoracic region: Secondary | ICD-10-CM | POA: Diagnosis not present

## 2022-09-27 DIAGNOSIS — M5137 Other intervertebral disc degeneration, lumbosacral region: Secondary | ICD-10-CM | POA: Diagnosis not present

## 2022-09-27 DIAGNOSIS — M47812 Spondylosis without myelopathy or radiculopathy, cervical region: Secondary | ICD-10-CM | POA: Diagnosis not present

## 2022-09-27 DIAGNOSIS — M4057 Lordosis, unspecified, lumbosacral region: Secondary | ICD-10-CM | POA: Diagnosis not present

## 2022-09-27 DIAGNOSIS — M47816 Spondylosis without myelopathy or radiculopathy, lumbar region: Secondary | ICD-10-CM | POA: Diagnosis not present

## 2022-09-28 ENCOUNTER — Ambulatory Visit: Payer: BC Managed Care – PPO | Admitting: Cardiology

## 2022-10-15 DIAGNOSIS — R002 Palpitations: Secondary | ICD-10-CM

## 2022-10-15 DIAGNOSIS — Z8249 Family history of ischemic heart disease and other diseases of the circulatory system: Secondary | ICD-10-CM | POA: Diagnosis not present

## 2022-10-15 DIAGNOSIS — E785 Hyperlipidemia, unspecified: Secondary | ICD-10-CM

## 2022-10-16 DIAGNOSIS — M4057 Lordosis, unspecified, lumbosacral region: Secondary | ICD-10-CM | POA: Diagnosis not present

## 2022-10-16 DIAGNOSIS — M47812 Spondylosis without myelopathy or radiculopathy, cervical region: Secondary | ICD-10-CM | POA: Diagnosis not present

## 2022-10-16 DIAGNOSIS — M9901 Segmental and somatic dysfunction of cervical region: Secondary | ICD-10-CM | POA: Diagnosis not present

## 2022-10-16 DIAGNOSIS — M9902 Segmental and somatic dysfunction of thoracic region: Secondary | ICD-10-CM | POA: Diagnosis not present

## 2022-10-16 DIAGNOSIS — M5137 Other intervertebral disc degeneration, lumbosacral region: Secondary | ICD-10-CM | POA: Diagnosis not present

## 2022-10-16 DIAGNOSIS — M9903 Segmental and somatic dysfunction of lumbar region: Secondary | ICD-10-CM | POA: Diagnosis not present

## 2022-10-16 DIAGNOSIS — M47816 Spondylosis without myelopathy or radiculopathy, lumbar region: Secondary | ICD-10-CM | POA: Diagnosis not present

## 2022-10-24 ENCOUNTER — Telehealth: Payer: Self-pay | Admitting: Cardiology

## 2022-10-24 NOTE — Telephone Encounter (Signed)
Pt called in wanting to ask "Has wearing the zio device been known to cause sensitivity in the chest cavity" please advise.

## 2022-10-24 NOTE — Telephone Encounter (Signed)
Left message for patient to call back  

## 2022-10-25 ENCOUNTER — Encounter: Payer: Self-pay | Admitting: Family Medicine

## 2022-10-25 ENCOUNTER — Ambulatory Visit (INDEPENDENT_AMBULATORY_CARE_PROVIDER_SITE_OTHER): Payer: 59 | Admitting: Family Medicine

## 2022-10-25 VITALS — BP 124/78 | HR 91 | Temp 98.1°F | Resp 18 | Wt 186.6 lb

## 2022-10-25 DIAGNOSIS — B029 Zoster without complications: Secondary | ICD-10-CM | POA: Diagnosis not present

## 2022-10-25 NOTE — Telephone Encounter (Signed)
Patient returned RN's call and requested call back after 10:15 am.

## 2022-10-25 NOTE — Progress Notes (Signed)
   Subjective:    Patient ID: Adrian Mcneil, male    DOB: 1966/07/04, 57 y.o.   MRN: [id number]  HPI He states that last Thursday he noted a rash present in the left mid thoracic area but only minimal discomfort and no radiation of the pain.  He then noted 2 days ago difficulty with a rash in the nipple area on the left.   Review of Systems     Objective:   Physical Exam 3-4 reddish lesions were noted in the T4 distribution on the left back area and several erythematous areas of the nipple area.       Assessment & Plan:  Herpes zoster without complication I think this is definitely shingles however at this point it is too far down the road really give him Valtrex.  Discussed staying away from pregnant women and young children.  Also recommend getting Shingrix every years down the road.

## 2022-10-25 NOTE — Telephone Encounter (Signed)
Called patient to share advice that he follow up with PCP as it is unlikely zio is cause of his symptoms given that the area that is tingling is lower than the area that zio was attached to. Patient verbalizes understanding and agrees to plan.

## 2022-10-25 NOTE — Telephone Encounter (Signed)
Called patient back after 10:15 AM per request to discuss his concerns about his heart monitor.   Patient last saw Dr. Radford Pax 09/26/22 and was prescribed zio monitor at that time. He states he put it on 10/15/22 and it feel off 10/22/22 after only a week of wear. He mailed it back to the company at at that time.  He states since that time the area BELOW the area where the heart monitor was affixed to his chest is tingling "like when your foot is trying to go numb from sitting in a weird position". He denies any pain, palpitations, SOB or radiating pain to jaw or left arm. He states he can sense hot/cold normally and that there is no change to the skin such as redness or irritation. He states his left lower pectoral area and left nipple "feel weird and tingly". He states his HR on his heart monitor app is 96 bpm. He states he used a massager on his pectoral muscle area yesterday which seemed to improve the tingling sensation.   Patient aware he has stress test scheduled 10/31/22, and CT coronary calcium score scheduled 11/03/22. Forwarded patient's concern to Dr. Radford Pax.

## 2022-10-26 DIAGNOSIS — Z8249 Family history of ischemic heart disease and other diseases of the circulatory system: Secondary | ICD-10-CM | POA: Diagnosis not present

## 2022-10-31 ENCOUNTER — Ambulatory Visit: Payer: 59

## 2022-10-31 ENCOUNTER — Other Ambulatory Visit: Payer: Self-pay | Admitting: Cardiology

## 2022-10-31 ENCOUNTER — Telehealth: Payer: Self-pay

## 2022-10-31 DIAGNOSIS — I499 Cardiac arrhythmia, unspecified: Secondary | ICD-10-CM

## 2022-10-31 DIAGNOSIS — G4733 Obstructive sleep apnea (adult) (pediatric): Secondary | ICD-10-CM

## 2022-10-31 DIAGNOSIS — R9431 Abnormal electrocardiogram [ECG] [EKG]: Secondary | ICD-10-CM

## 2022-10-31 DIAGNOSIS — Z8249 Family history of ischemic heart disease and other diseases of the circulatory system: Secondary | ICD-10-CM

## 2022-10-31 DIAGNOSIS — E785 Hyperlipidemia, unspecified: Secondary | ICD-10-CM

## 2022-10-31 DIAGNOSIS — R002 Palpitations: Secondary | ICD-10-CM

## 2022-10-31 NOTE — Telephone Encounter (Signed)
Discussed with patient results from long term heart monitor as well as Dr. Theodosia Blender recommendations for Itamar sleep study and 2D echo. Patient verbalizes understanding and agrees to plan, orders placed.

## 2022-10-31 NOTE — Telephone Encounter (Signed)
-----   Message from Sueanne Margarita, MD sent at 10/29/2022  6:44 PM EDT ----- Patient had rare extra heart beats from the top and bottom of the heart call PVCs and PACs were are benign.  He did have transient dropped heart beats during sleep and I would like him to have an Itamar home sleep study to rule out OSA

## 2022-11-03 ENCOUNTER — Encounter: Payer: Self-pay | Admitting: Cardiology

## 2022-11-03 ENCOUNTER — Ambulatory Visit (HOSPITAL_BASED_OUTPATIENT_CLINIC_OR_DEPARTMENT_OTHER)
Admission: RE | Admit: 2022-11-03 | Discharge: 2022-11-03 | Disposition: A | Discharge status: Home / Self Care | Payer: 59 | Source: Ambulatory Visit | Attending: Cardiology | Admitting: Cardiology

## 2022-11-03 DIAGNOSIS — Z8249 Family history of ischemic heart disease and other diseases of the circulatory system: Secondary | ICD-10-CM | POA: Insufficient documentation

## 2022-11-03 DIAGNOSIS — E785 Hyperlipidemia, unspecified: Secondary | ICD-10-CM | POA: Insufficient documentation

## 2022-11-03 DIAGNOSIS — R931 Abnormal findings on diagnostic imaging of heart and coronary circulation: Secondary | ICD-10-CM | POA: Insufficient documentation

## 2022-11-03 DIAGNOSIS — R002 Palpitations: Secondary | ICD-10-CM | POA: Insufficient documentation

## 2022-11-06 ENCOUNTER — Telehealth: Payer: Self-pay | Admitting: Cardiology

## 2022-11-06 ENCOUNTER — Telehealth: Payer: Self-pay | Admitting: *Deleted

## 2022-11-06 DIAGNOSIS — E785 Hyperlipidemia, unspecified: Secondary | ICD-10-CM

## 2022-11-06 NOTE — Telephone Encounter (Signed)
  READY-APPROVED-NO PA REQ   Reesa Chew, CMA3 hours ago (3:08 PM)    Prior Authorization for [employer] sent to [employer] via web portal. Tracking Number . READY-APPROVED-NO PA REQ

## 2022-11-06 NOTE — Telephone Encounter (Signed)
S/w pt and he will come by the office tomorrow to set up Itamar study 11/07/22 @ 11:30.   Stop bang score 3. Pt has been approved and will be given PIN# [id number] at set up      Sleep Apnea Evaluation  [facility]  Today's Date: 11/06/2022   Patient Name: Adrian Mcneil        DOB: 12/12/1965       Height:        Weight:    BMI: There is no height or weight on file to calculate BMI.    Referring Provider:     STOP-BANG RISK ASSESSMENT         If STOP-BANG Score ?3 OR two clinical symptoms - patient qualifies for WatchPAT (CPT 95800)      Sleep study ordered due to two (2) of the following clinical symptoms/diagnoses:  Excessive daytime sleepiness G47.10  Gastroesophageal reflux K21.9  Nocturia R35.1  Morning Headaches G44.221  Difficulty concentrating R41.840  Memory problems or poor judgment G31.84  Personality changes or irritability R45.4  Loud snoring R06.83  Depression F32.9  Unrefreshed by sleep G47.8  Impotence N52.9  History of high blood pressure R03.0  Insomnia G47.00  Sleep Disordered Breathing or Sleep Apnea ICD G47.33

## 2022-11-06 NOTE — Telephone Encounter (Signed)
Spoke with patient and discussed results of CT scan.  Per Dr. Mayford Knife: Noncardiac findings on CT showed stable gynecomastia and otherwise normal - please forward to PCP        Minimal coronary calcium - please make sure he is taking his Crestor daily and get an FLP and ALT in 6 weeks   Patient states he would like to discuss natural alternatives for cholesterol management with Dr. Mayford Knife. He would like to try to get off of statins.  Will forward to Dr. Norris Cross nurse to follow-up.

## 2022-11-06 NOTE — Telephone Encounter (Signed)
Prior Authorization for [facility] sent to [employer] via web portal. Tracking Number . READY-APPROVED-NO PA REQ

## 2022-11-06 NOTE — Telephone Encounter (Signed)
-----   Message from Quintella Reichert, MD sent at 11/03/2022  7:24 PM EDT ----- Minimal coronary calcium - please make sure he is taking his Crestor daily and get an FLP and ALT in 6 weeks

## 2022-11-07 ENCOUNTER — Encounter (INDEPENDENT_AMBULATORY_CARE_PROVIDER_SITE_OTHER): Payer: 59 | Admitting: Cardiology

## 2022-11-07 DIAGNOSIS — G4733 Obstructive sleep apnea (adult) (pediatric): Secondary | ICD-10-CM

## 2022-11-07 NOTE — Telephone Encounter (Signed)
Pt came by the office today and has been set up for Itamar study. Pt has been approved and has been given PIN# [id number] today. Pt states he will try to the study tonight or the at least by this weekend. Called and made the patient aware that he may proceed with the [facility] Sleep Study. PIN # provided to the patient. Patient made aware that he will be contacted after the test has been read with the results and any recommendations. Patient verbalized understanding and thanked me for the call.

## 2022-11-09 ENCOUNTER — Ambulatory Visit: Payer: 59 | Attending: Cardiology

## 2022-11-09 ENCOUNTER — Telehealth: Payer: Self-pay | Admitting: *Deleted

## 2022-11-09 DIAGNOSIS — G4733 Obstructive sleep apnea (adult) (pediatric): Secondary | ICD-10-CM

## 2022-11-09 NOTE — Telephone Encounter (Signed)
-----   Message from Gaynelle Cage, [place] sent at 11/09/2022  3:50 PM EDT -----  ----- Message ----- From: Quintella Reichert, MD Sent: 11/09/2022   9:13 AM EDT To: [facility] Sleep Studies  Very minimal OSA mainly in supine position.  No indication for CPAP.  Needs to avoid sleeping supine

## 2022-11-09 NOTE — Telephone Encounter (Signed)
The patient has been notified of the result and verbalized understanding.  All questions (if any) were answered. Latrelle Dodrill, CMA 11/09/2022 6:53 PM    Pt is aware and agreeable to normal results.

## 2022-11-09 NOTE — Procedures (Signed)
SLEEP STUDY REPORT Patient Information Study Date: 11/07/2022 Patient Name: Adrian Mcneil Patient ID: [id number] Birth Date: 06/06/1966 Age: 57 Gender: Male BMI: 26.5 (W=185 lb, H=5' 10'') Stopbang: 3 Referring Physician: Armanda Magic, MD  TEST DESCRIPTION: Home sleep apnea testing was completed using the WatchPat, a Type 1 device, utilizing peripheral arterial tonometry (PAT), chest movement, actigraphy, pulse oximetry, pulse rate, body position and snore. AHI was calculated with apnea and hypopnea using valid sleep time as the denominator. RDI includes apneas, hypopneas, and RERAs. The data acquired and the scoring of sleep and all associated events were performed in accordance with the recommended standards and specifications as outlined in the AASM Manual for the Scoring of Sleep and Associated Events 2.2.0 (2015).   FINDINGS:   1. Minimal Obstructive Sleep Apnea with AHI 5.0/hr. Events occurred mainly in the supine position.  2. No Central Sleep Apnea with pAHIc 0.2/hr.   3. Oxygen desaturations as low as 89%.   4. Moderate to severe snoring was present. O2 sats were < 88% for 0 min.   5. Total sleep time was 6 hrs and 45 min.   6. 14.1% of total sleep time was spent in REM sleep.   7. Shortened sleep onset latency at 6 min.   8. Shortened REM sleep onset latency at 55 min.   9. Total awakenings were 9.  10. Arrhythmia detection:  None.  DIAGNOSIS: Minimal Obstructive Sleep Apnea (G47.33)  RECOMMENDATIONS:   1.  Clinical correlation of these findings is necessary.  The decision to treat obstructive sleep apnea (OSA) is usually based on the presence of apnea symptoms or the presence of associated medical conditions such as Hypertension, Congestive Heart Failure, Atrial Fibrillation or Obesity.  The most common symptoms of OSA are snoring, gasping for breath while sleeping, daytime sleepiness and fatigue.   2.  Initiating apnea therapy is recommended given the  presence of symptoms and/or associated conditions. Recommend proceeding with one of the following:     a.  Auto-CPAP therapy with a pressure range of 5-20cm H2O.     b.  An oral appliance (OA) that can be obtained from certain dentists with expertise in sleep medicine.  These are primarily of use in non-obese patients with mild and moderate disease.     c.  An ENT consultation which may be useful to look for specific causes of obstruction and possible treatment options.     d.  If patient is intolerant to PAP therapy, consider referral to ENT for evaluation for hypoglossal nerve stimulator.   3.  Close follow-up is necessary to ensure success with CPAP or oral appliance therapy for maximum benefit.  4.  A follow-up oximetry study on CPAP is recommended to assess the adequacy of therapy and determine the need for supplemental oxygen or the potential need for Bi-level therapy.  An arterial blood gas to determine the adequacy of baseline ventilation and oxygenation should also be considered.  5.  Healthy sleep recommendations include:  adequate nightly sleep (normal 7-9 hrs/night), avoidance of caffeine after noon and alcohol near bedtime, and maintaining a sleep environment that is cool, dark and quiet.  6.  Weight loss for overweight patients is recommended.  Even modest amounts of weight loss can significantly improve the severity of sleep apnea.  7.  Snoring recommendations include:  weight loss where appropriate, side sleeping, and avoidance of alcohol before bed.  8.  Operation of motor vehicle should be avoided when sleepy.  Signature:   Armanda Magic, MD; [facility]; Diplomat, American Board of Sleep Medicine Electronically Signed: 11/09/2022 9:09:41 AM

## 2022-11-09 NOTE — Telephone Encounter (Signed)
Called patient to find out more about whether he is taking his statin medication.   He states he stopped taking his statin several months ago due to joint pain and body aches. He has only been taking a fish oil supplement. He states he started taking citrus bergamot supplement on 11/02/22 and he also has increased his walking exercise. He wants to check his cholesterol labs again to see if this has helped his cholesterol levels. He is considering adding red yeast rice to also help his cholesterol. Forwarded to Dr. Mayford Knife.

## 2022-11-13 NOTE — Addendum Note (Signed)
Addended by: Luellen Pucker on: 11/13/2022 09:59 AM   Modules accepted: Orders

## 2022-11-13 NOTE — Telephone Encounter (Signed)
Called patient to discuss Dr. Norris Cross recommendations for lipid clinic referral to discuss his concerns about supplements to lower cholesterol. Patient agrees to lipid clinic referral, verbalizes understanding that Dr. Mayford Knife does not recommend red yeast rice, referral placed.

## 2022-11-14 ENCOUNTER — Ambulatory Visit: Payer: 59 | Attending: Cardiology

## 2022-11-14 ENCOUNTER — Telehealth: Payer: Self-pay

## 2022-11-14 DIAGNOSIS — R9431 Abnormal electrocardiogram [ECG] [EKG]: Secondary | ICD-10-CM | POA: Diagnosis not present

## 2022-11-14 DIAGNOSIS — R002 Palpitations: Secondary | ICD-10-CM | POA: Diagnosis not present

## 2022-11-14 DIAGNOSIS — E785 Hyperlipidemia, unspecified: Secondary | ICD-10-CM

## 2022-11-14 DIAGNOSIS — Z8249 Family history of ischemic heart disease and other diseases of the circulatory system: Secondary | ICD-10-CM | POA: Diagnosis not present

## 2022-11-14 LAB — EXERCISE TOLERANCE TEST
Angina Index: 0
Duke Treadmill Score: 10
Estimated workload: 11.8
Exercise duration (min): 10 min
Exercise duration (sec): 5 s
Peak HR: 169 {beats}/min
Percent HR: 102 %
Rest HR: 85 {beats}/min
ST Depression (mm): 0 mm

## 2022-11-14 NOTE — Telephone Encounter (Signed)
Reviewed stress test results with patient, explained that no evidence of ischemia was found. Patient verbalizes understanding.

## 2022-11-14 NOTE — Telephone Encounter (Signed)
-----   Message from Traci R Turner, MD sent at 11/14/2022  2:33 PM EDT ----- Please let patient know that stress test was fine 

## 2022-11-23 ENCOUNTER — Ambulatory Visit (INDEPENDENT_AMBULATORY_CARE_PROVIDER_SITE_OTHER): Payer: 59

## 2022-11-23 DIAGNOSIS — R9431 Abnormal electrocardiogram [ECG] [EKG]: Secondary | ICD-10-CM | POA: Diagnosis not present

## 2022-11-23 DIAGNOSIS — I499 Cardiac arrhythmia, unspecified: Secondary | ICD-10-CM | POA: Diagnosis not present

## 2022-11-23 LAB — ECHOCARDIOGRAM COMPLETE
Calc EF: 62.7 %
S' Lateral: 1.98 cm
Single Plane A2C EF: 70.2 %
Single Plane A4C EF: 49.6 %

## 2022-11-24 ENCOUNTER — Telehealth: Payer: Self-pay

## 2022-11-24 NOTE — Telephone Encounter (Signed)
-----   Message from Quintella Reichert, MD sent at 11/23/2022  9:00 PM EDT ----- Normal heart function with mildly thickened heart muscle, trivial leakiness of MV

## 2022-11-24 NOTE — Telephone Encounter (Signed)
Called patient to discuss Echo results. Per Dr. Mayford Knife, Normal heart function with mildly thickened heart muscle, trivial leakiness of MV. Patient verbalizes understanding.

## 2022-12-07 ENCOUNTER — Ambulatory Visit: Payer: 59 | Attending: Internal Medicine

## 2022-12-07 NOTE — Progress Notes (Deleted)
Patient ID: Adrian Mcneil                 DOB: 18-Jul-1966                    MRN: [id number]      HPI: Adrian Mcneil is a 57 y.o. male patient referred to lipid clinic by Dr. Mayford Knife. PMH is significant for HTN, GERD and family history of CAD. His father had his first MI at 17 and then subsequent PCI x2. He was a heavy smoker. Patient had a repeat CAC in 2024 with a score of 3.2 (46th percentile). Was previously on rosuvastatin 20mg  daily.  MESA 10 year risk 4.7% (coronary age 89). Last LDL-C 181.   PREVENT 10-year risk of ASCVD = 3.0% 30-year risk of ASCVD = 16.0%  Took rosuvastatin?     Reviewed options for lowering LDL cholesterol, including ezetimibe, PCSK-9 inhibitors, bempedoic acid and inclisiran.  Discussed mechanisms of action, dosing, side effects and potential decreases in LDL cholesterol.  Also reviewed cost information and potential options for patient assistance.   Current Medications:  Intolerances:  Risk Factors:  LDL-C goal:  ApoB goal:   Diet:   Exercise:   Family History: his father had his first MI at 75 and then subsequent PCI x2. He was a heavy smoker. His mom has pancreatic CA.   Social History:   Labs: Lipid Panel     Component Value Date/Time   CHOL 247 (H) 06/26/2022 1005   TRIG 101 06/26/2022 1005   HDL 48 06/26/2022 1005   CHOLHDL 5.1 (H) 06/26/2022 1005   CHOLHDL 5.9 07/27/2014 1506   VLDL 49 (H) 07/27/2014 1506   LDLCALC 181 (H) 06/26/2022 1005   LABVLDL 18 06/26/2022 1005    Past Medical History:  Diagnosis Date   Acid indigestion    Agatston coronary artery calcium score less than 100    calcium score 3.2 by CT 10/2022   Allergic rhinitis, cause unspecified    seasonal   Cancer (HCC)    skin   Dyslipidemia    Erectile dysfunction    FHx: cardiovascular disease    Hyperlipidemia    Ulcerative colitis    Dr. Randa Evens    Current Outpatient Medications on File Prior to Visit  Medication Sig Dispense Refill    clindamycin (CLINDAGEL) 1 % gel Apply topically daily. as directed 60 g 5   co-enzyme Q-10 30 MG capsule Take 30 mg by mouth daily.     doxycycline (VIBRA-TABS) 100 MG tablet TAKE 2 TABLETS DAILY AS NEEDED FOR SEXUALLY ACTIVITY 28 tablet 2   mesalamine (APRISO) 0.375 g 24 hr capsule Take 375 mg by mouth daily.     Multiple Vitamins-Minerals (EMERGEN-C IMMUNE PO) Take by mouth.     Multiple Vitamins-Minerals (EYE VITAMINS PO) Take by mouth.     naproxen sodium (ALEVE) 220 MG tablet Take 220 mg by mouth.     omega-3 acid ethyl esters (LOVAZA) 1 g capsule Take 1 g by mouth daily.     omeprazole (PRILOSEC) 20 MG capsule Take 20 mg by mouth daily.     rosuvastatin (CRESTOR) 20 MG tablet Take 1 tablet (20 mg total) by mouth daily. 90 tablet 3   sildenafil (REVATIO) 20 MG tablet TAKE UP TO 5 TABLETS BY MOUTH AS NEEDED FOR ERECTION 60 tablet 5   triamcinolone cream (KENALOG) 0.5 % APPLY 1 APPLICATION TOPICALLY 3 (THREE) TIMES DAILY. 30 g 0   TURMERIC PO  Take 1 capsule by mouth.     valACYclovir (VALTREX) 1000 MG tablet TAKE 2 TABLETS BY MOUTH TWICE A DAY 4 tablet 2   No current facility-administered medications on file prior to visit.    No Known Allergies  Assessment/Plan:  1. Hyperlipidemia -  No problem-specific Assessment & Plan notes found for this encounter.    Thank you,  Olene Floss, Pharm.D, BCPS, CPP  [facility] [address], [place], [place] [address]  Phone: [phone]; Fax: [phone]

## 2022-12-08 ENCOUNTER — Telehealth: Payer: Self-pay | Admitting: Cardiology

## 2022-12-08 ENCOUNTER — Encounter: Payer: Self-pay | Admitting: Cardiology

## 2022-12-08 NOTE — Telephone Encounter (Signed)
  Per MyChart scheduling message:   Actually, I did show up and spoke with a receptionist lady and found out just to speak to the Pharmacists would cost $100 and may incur additional out of pocket expense once insurance decided what they would pay and what I had to pick up out of pocket.  Based on previous conversation with Dr Norris Cross nurse/support staff, I assumed this Pharmacist consultation (when she recommended this service) around medications would not incur a $100 co-pay charge just to see and speak with them. I have so far paid to have a calcium score test, paid to wear a Zio heart monitor, paid for a sleep study test, paid for a stress test. Based on all these tests I wanted to be proactive and discuss options and other courses of treatments other than a stain -- and I assumed speaking to the Pharmacists when I was asking too many questions to the nurse would be a consultation that would not incur a charge associated with it. Please advise next steps and who I can speak to about the questions I have around medications and supplements. Thank you very much.

## 2022-12-08 NOTE — Telephone Encounter (Signed)
Called patient to offer visit w/ APP to discuss concerns about starting statin. Patient states his copay for APP/Dr. Mayford Knife is also $100 because it's a specialist visit.  Patient states he wants to try to get his cholesterol better with red yeast rice and citrus bergamot on his own anyway. Advised patient of non-statin drugs such as zetia or lovaza, as well as serial labs to assess whether supplements are working. He states he would be more comfortable working w/ his PCP on this issue since his copay is so high to do anything with our office. Forwarded responses to Dr. Mayford Knife.

## 2023-01-25 ENCOUNTER — Other Ambulatory Visit: Payer: Self-pay | Admitting: Family Medicine

## 2023-01-25 MED ORDER — DOXYCYCLINE HYCLATE 100 MG PO TABS: ORAL_TABLET | ORAL | 2 refills | Status: DC

## 2023-02-02 ENCOUNTER — Telehealth: Payer: Self-pay | Admitting: Family Medicine

## 2023-02-02 NOTE — Telephone Encounter (Signed)
Forwarding to provider for approval.

## 2023-02-02 NOTE — Telephone Encounter (Signed)
[facility]  fax request for Valacyclovir Hcl 1 gram

## 2023-02-03 MED ORDER — VALACYCLOVIR HCL 1 G PO TABS: 2000.0000 mg | ORAL_TABLET | Freq: Two times a day (BID) | ORAL | 0 refills | Status: DC

## 2023-03-09 ENCOUNTER — Other Ambulatory Visit: Payer: Self-pay | Admitting: Family Medicine

## 2023-03-09 DIAGNOSIS — N529 Male erectile dysfunction, unspecified: Secondary | ICD-10-CM

## 2023-03-09 NOTE — Telephone Encounter (Signed)
Last apt 10/25/22.

## 2023-03-20 ENCOUNTER — Other Ambulatory Visit: Payer: Self-pay | Admitting: Family Medicine

## 2023-03-20 NOTE — Telephone Encounter (Signed)
Forwarding to provider for approval.   Last refilled: 02/03/2023

## 2023-04-04 ENCOUNTER — Other Ambulatory Visit: Payer: Self-pay | Admitting: Family Medicine

## 2023-04-05 NOTE — Telephone Encounter (Signed)
Is this okay to refill? 

## 2023-04-19 IMAGING — US US ABDOMEN LIMITED
1 series · 14 of 25 positions shown · non-contrast
Comparison: US Abdomen, 11/17/2016.  CT cardiac, 02/12/2018.

CLINICAL DATA: Elevated LFTs.

EXAM:
ULTRASOUND ABDOMEN LIMITED RIGHT UPPER QUADRANT

[Series 1: us abdomen limited · 0.19mm/px · 14 of 49 slices shown]
[im 1/49]
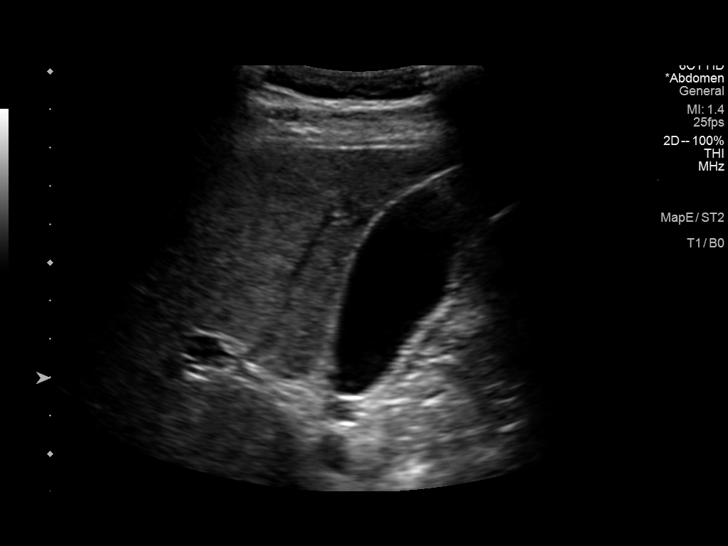
[im 5/49]
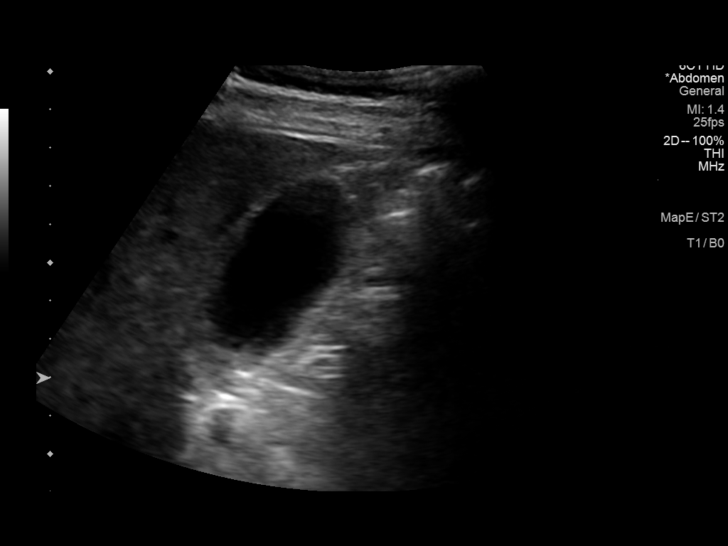
[im 9/49]
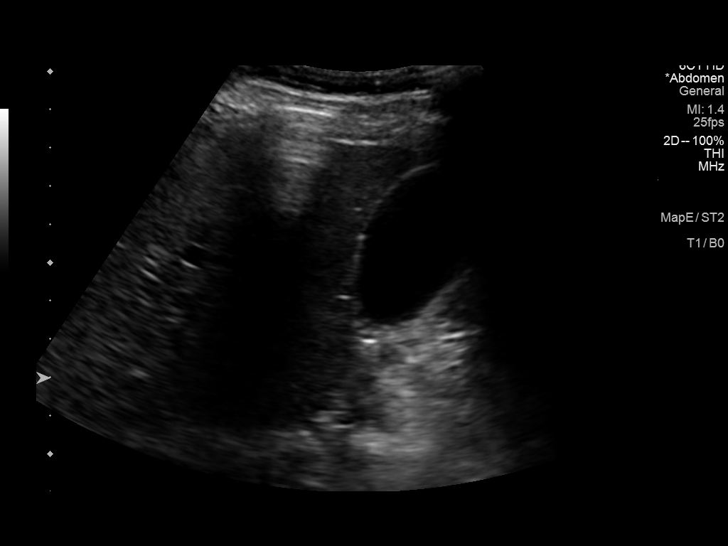
[im 13/49]
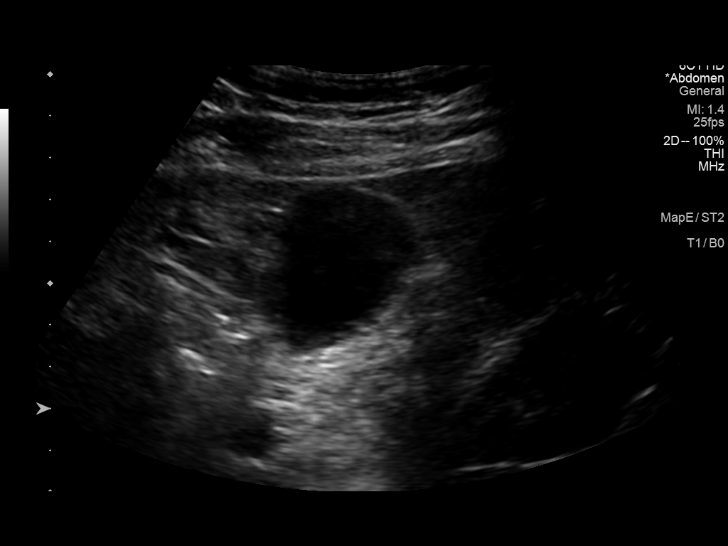
[im 17/49]
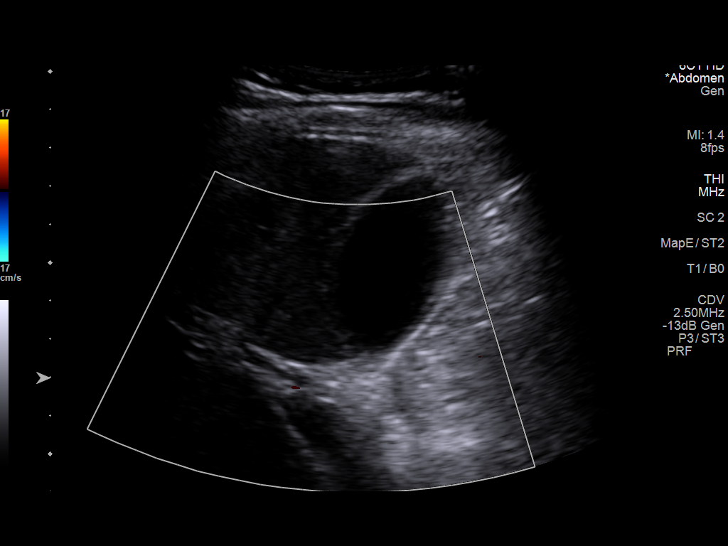
[im 19/49]
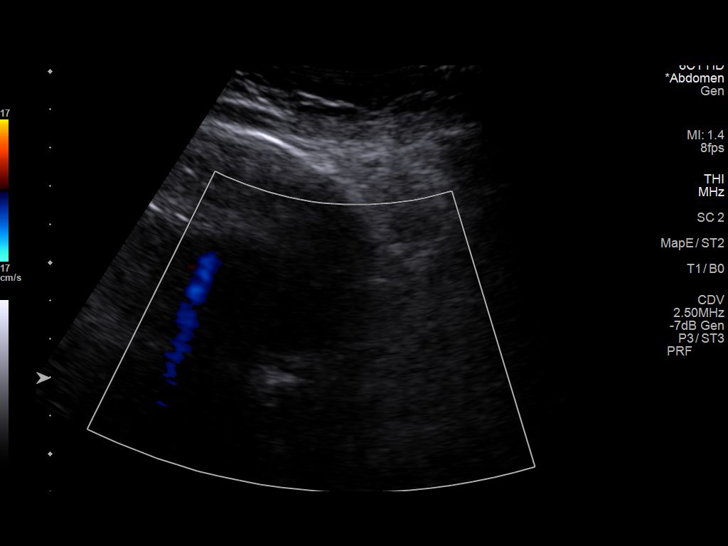
[im 23/49]
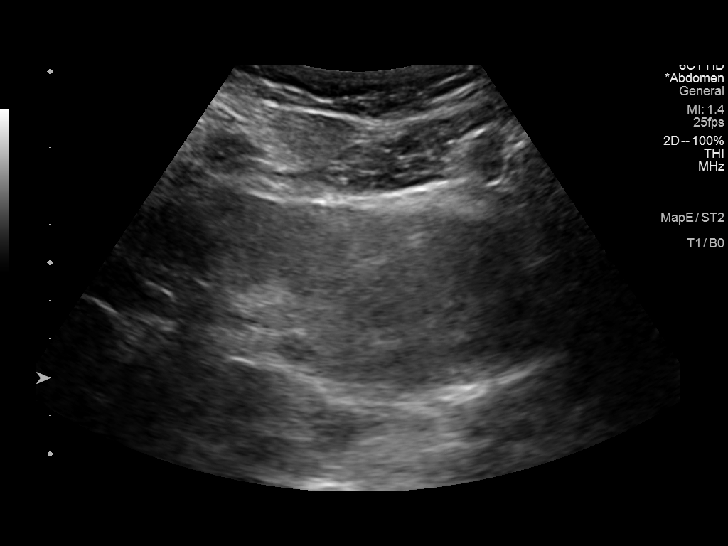
[im 27/49]
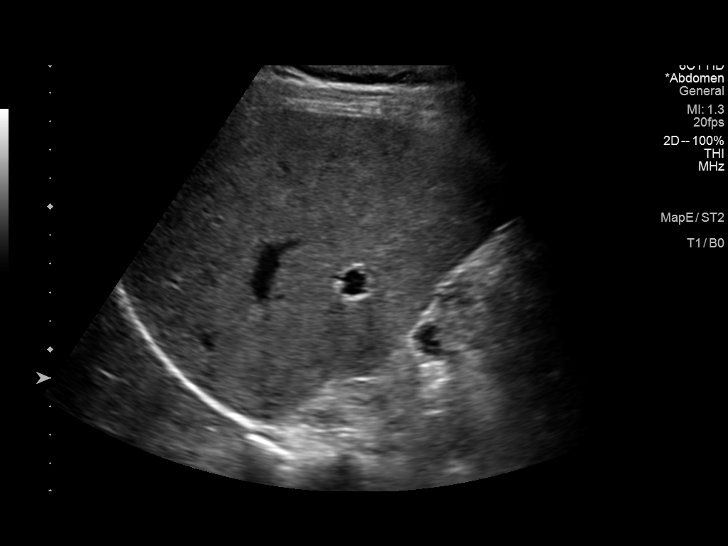
[im 31/49]
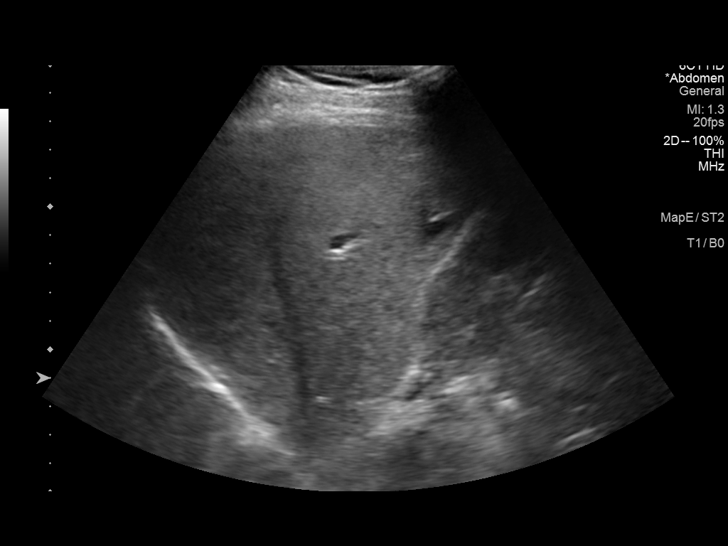
[im 33/49]
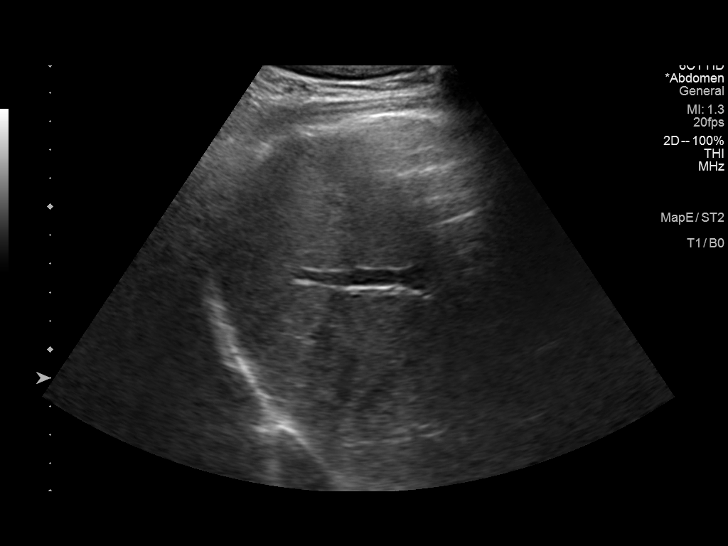
[im 37/49]
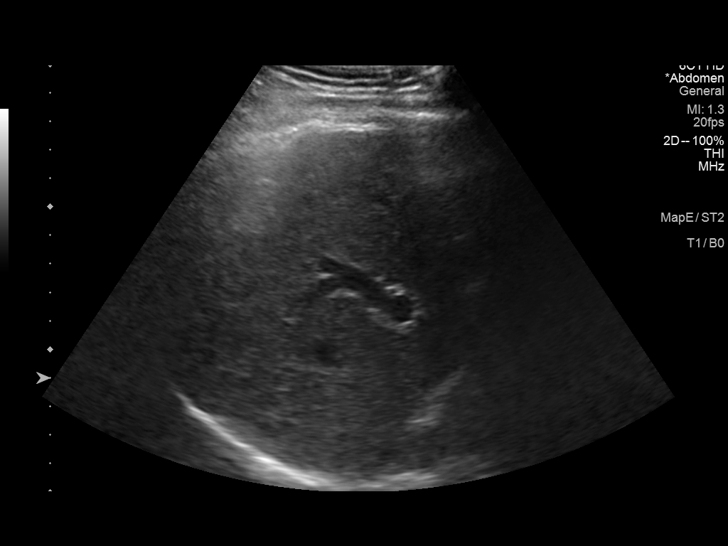
[im 41/49]
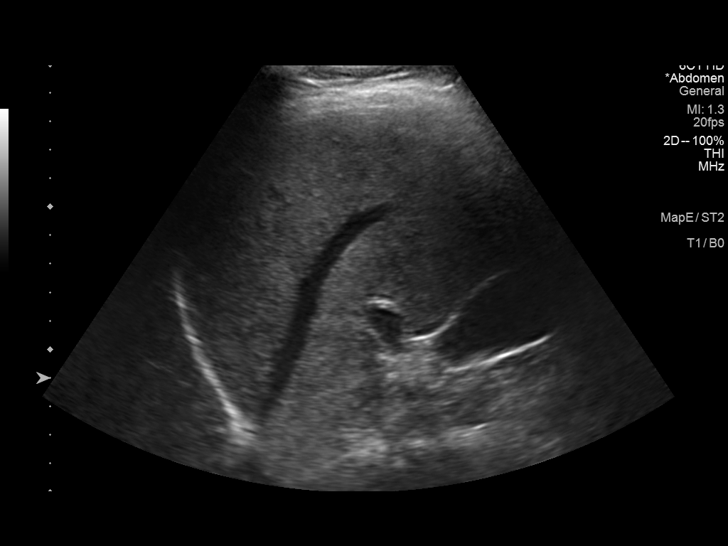
[im 45/49]
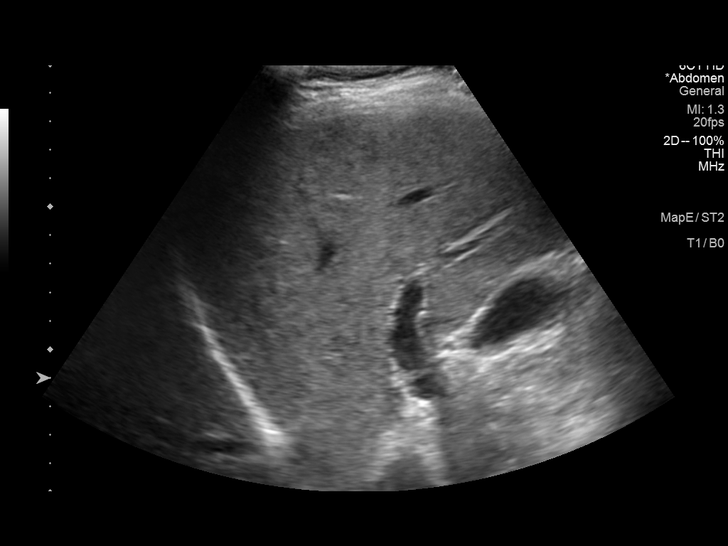
[im 49/49]
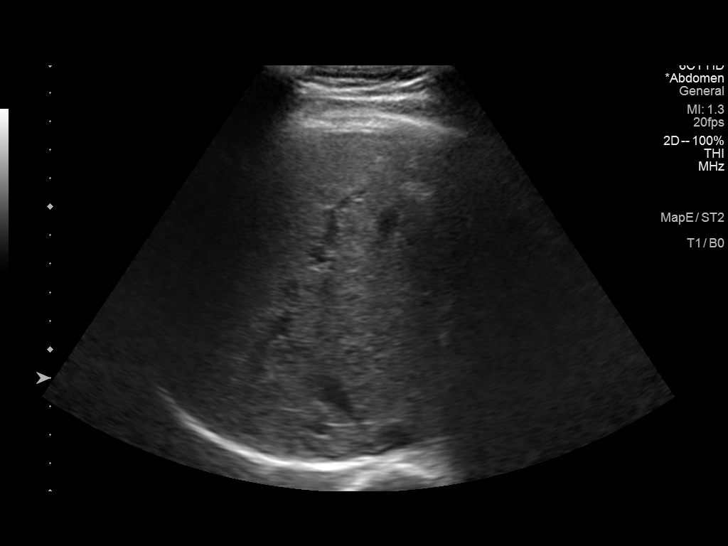

[14 of 25 positions shown; findings below may reference images not displayed]

FINDINGS: Gallbladder:

No gallstones or wall thickening visualized. No sonographic Murphy
sign noted by sonographer.

Common bile duct:

Diameter: 0.4 cm

Liver:

No focal lesion identified. Mild relatively increased hepatic
parenchymal echogenicity. Portal vein is patent on color Doppler
imaging with normal direction of blood flow towards the liver.

Other: No perihepatic ascites.
IMPRESSION: 1. No acute sonographic findings within the RIGHT upper quadrant.
2. Echogenic liver. Findings most commonly seen in hepatic
steatosis, though may also represent hepatitis and/or fibrosis.

## 2023-05-31 DIAGNOSIS — L309 Dermatitis, unspecified: Secondary | ICD-10-CM | POA: Diagnosis not present

## 2023-05-31 DIAGNOSIS — D225 Melanocytic nevi of trunk: Secondary | ICD-10-CM | POA: Diagnosis not present

## 2023-05-31 DIAGNOSIS — L820 Inflamed seborrheic keratosis: Secondary | ICD-10-CM | POA: Diagnosis not present

## 2023-05-31 DIAGNOSIS — I788 Other diseases of capillaries: Secondary | ICD-10-CM | POA: Diagnosis not present

## 2023-05-31 DIAGNOSIS — L814 Other melanin hyperpigmentation: Secondary | ICD-10-CM | POA: Diagnosis not present

## 2023-05-31 DIAGNOSIS — Z85828 Personal history of other malignant neoplasm of skin: Secondary | ICD-10-CM | POA: Diagnosis not present

## 2023-05-31 DIAGNOSIS — L821 Other seborrheic keratosis: Secondary | ICD-10-CM | POA: Diagnosis not present

## 2023-06-20 ENCOUNTER — Other Ambulatory Visit: Payer: Self-pay | Admitting: Family Medicine

## 2023-06-20 NOTE — Telephone Encounter (Signed)
Last apt 10/25/22.

## 2023-07-12 ENCOUNTER — Encounter: Payer: Self-pay | Admitting: Family Medicine

## 2023-07-12 ENCOUNTER — Other Ambulatory Visit: Payer: Self-pay | Admitting: Family Medicine

## 2023-07-12 ENCOUNTER — Ambulatory Visit: Payer: 59 | Admitting: Family Medicine

## 2023-07-12 VITALS — BP 124/72 | HR 105 | Temp 97.8°F | Wt 183.4 lb

## 2023-07-12 DIAGNOSIS — Z209 Contact with and (suspected) exposure to unspecified communicable disease: Secondary | ICD-10-CM

## 2023-07-12 DIAGNOSIS — K148 Other diseases of tongue: Secondary | ICD-10-CM

## 2023-07-12 NOTE — Progress Notes (Signed)
   Subjective:    Patient ID: Adrian Mcneil, male    DOB: Jun 27, 1966, 57 y.o.   MRN: [id number]  HPI Recently noted a slightly uncomfortable lesion on the left posterior part of his tongue that he would like further evaluated.  He also needs routine STD follow-up.   Review of Systems     Objective:    Physical Exam Of the tongue visually and palpably does show a slightly uncomfortable lesion in the left posterior tongue area.  No adenopathy noted in the neck.  Otherwise the mouth shows no lesions.       Assessment & Plan:  Tongue lesion - Plan: Ambulatory referral to ENT  Contact with or exposure to communicable disease - Plan: RPR+HIV+GC+CT Panel, Ct/GC NAA, Pharyngeal

## 2023-07-15 LAB — CT/GC NAA, PHARYNGEAL

## 2023-07-15 LAB — RPR+HIV+GC+CT PANEL
HIV Screen 4th Generation wRfx: NONREACTIVE
RPR Ser Ql: NONREACTIVE

## 2023-08-20 ENCOUNTER — Ambulatory Visit (INDEPENDENT_AMBULATORY_CARE_PROVIDER_SITE_OTHER): Payer: 59 | Admitting: Otolaryngology

## 2023-08-20 ENCOUNTER — Encounter (INDEPENDENT_AMBULATORY_CARE_PROVIDER_SITE_OTHER): Payer: Self-pay | Admitting: Otolaryngology

## 2023-08-20 VITALS — BP 154/79 | HR 82 | Ht 70.0 in | Wt 175.0 lb

## 2023-08-20 DIAGNOSIS — R0989 Other specified symptoms and signs involving the circulatory and respiratory systems: Secondary | ICD-10-CM

## 2023-08-20 DIAGNOSIS — R09A2 Foreign body sensation, throat: Secondary | ICD-10-CM | POA: Diagnosis not present

## 2023-08-20 DIAGNOSIS — K219 Gastro-esophageal reflux disease without esophagitis: Secondary | ICD-10-CM

## 2023-08-20 DIAGNOSIS — R0982 Postnasal drip: Secondary | ICD-10-CM

## 2023-08-20 DIAGNOSIS — J3089 Other allergic rhinitis: Secondary | ICD-10-CM

## 2023-08-20 DIAGNOSIS — J392 Other diseases of pharynx: Secondary | ICD-10-CM | POA: Diagnosis not present

## 2023-08-20 DIAGNOSIS — J342 Deviated nasal septum: Secondary | ICD-10-CM

## 2023-08-20 DIAGNOSIS — J343 Hypertrophy of nasal turbinates: Secondary | ICD-10-CM

## 2023-08-20 MED ORDER — FLUTICASONE PROPIONATE 50 MCG/ACT NA SUSP
2.0000 | Freq: Two times a day (BID) | NASAL | 6 refills | Status: DC
Start: 2023-08-20 — End: 2024-04-17

## 2023-08-20 MED ORDER — CETIRIZINE HCL 10 MG PO TABS: 10.0000 mg | ORAL_TABLET | Freq: Every day | ORAL | 11 refills | Status: DC

## 2023-08-20 NOTE — Progress Notes (Signed)
ENT CONSULT:  Reason for Consult: history of the left tongue lesion throat discomfort  HPI: Discussed the use of AI scribe software for clinical note transcription with the patient, who gave verbal consent to proceed.  History of Present Illness   The patient, a 58 year old with a history of GERD, presented with a chief complaint of a lesion on the left side of the tongue, which was first noticed in December of the previous year, and has since resolved. The lesion was described as a small, raised, flesh-colored papule that was sensitive to touch, particularly when brushing the tongue. The patient reported no associated pain, and the lesion has since resolved.  The patient denied any risk factors for oral cancer such as smoking, heavy alcohol use, or chewing tobacco. However, they expressed concern about potential oral cancer due to the lesion, prompting the consultation.  In addition to the tongue lesion, the patient reported a sensation of their uvula touching the back of their tongue, which they described as uncomfortable. They noted that this sensation seemed to occur more frequently when consuming certain liquids or creamy foods.  The patient also reported a history of acid reflux and GERD, for which they were previously treated with a course of Prilosec. They noted that symptoms of reflux tend to flare up when overeating, and they have made dietary modifications to manage this.  The patient denied any difficulty swallowing or sensation of food getting stuck. They also denied any other symptoms related to the throat, such as throat clearing or sensation of a lump.    Past Medical History:  Diagnosis Date   Acid indigestion    Agatston coronary artery calcium score less than 100    calcium score 3.2 by CT 10/2022   Allergic rhinitis, cause unspecified    seasonal   Cancer (HCC)    skin   Dyslipidemia    Erectile dysfunction    FHx: cardiovascular disease    Hyperlipidemia     Ulcerative colitis    Dr. Randa Evens    Past Surgical History:  Procedure Laterality Date   ANTERIOR CRUCIATE LIGAMENT REPAIR Right 08/19/2015   Procedure: RECONSTRUCTION ANTERIOR CRUCIATE LIGAMENT (ACL) WITH HAMSTRING GRAFT, PARTIAL MEDIAL MENISECTOMY.  ;  Surgeon: Cammy Copa, MD;  Location: [facility] OR;  Service: Orthopedics;  Laterality: Right;   COLONOSCOPY  2007   Dr. Randa Evens    Family History  Problem Relation Age of Onset   Hyperlipidemia Mother    Heart disease Father 21       MI   Cancer Sister        skin    Social History:  reports that he has never smoked. He has never used smokeless tobacco. He reports current alcohol use. He reports that he does not use drugs.  Allergies: No Known Allergies  Medications: I have reviewed the patient's current medications.  The PMH, PSH, Medications, Allergies, and SH were reviewed and updated.  ROS: Constitutional: Negative for fever, weight loss and weight gain. Cardiovascular: Negative for chest pain and dyspnea on exertion. Respiratory: Is not experiencing shortness of breath at rest. Gastrointestinal: Negative for nausea and vomiting. Neurological: Negative for headaches. Psychiatric: The patient is not nervous/anxious  Blood pressure (!) 154/79, pulse 82, height 5\' 10"  (1.778 m), weight 175 lb (79.4 kg), SpO2 99%.  PHYSICAL EXAM:  Exam: General: Well-developed, well-nourished Respiratory Respiratory effort: Equal inspiration and expiration without stridor Cardiovascular Peripheral Vascular: Warm extremities with equal color/perfusion Eyes: No nystagmus with equal extraocular motion bilaterally  Neuro/Psych/Balance: Patient oriented to person, place, and time; Appropriate mood and affect; Gait is intact with no imbalance; Cranial nerves I-XII are intact Head and Face Inspection: Normocephalic and atraumatic without mass or lesion Palpation: Facial skeleton intact without bony stepoffs Salivary Glands: No mass or  tenderness Facial Strength: Facial motility symmetric and full bilaterally ENT Pinna: External ear intact and fully developed External canal: Canal is patent with intact skin Tympanic Membrane: Clear and mobile External Nose: No scar or anatomic deformity Internal Nose: Septum is deviated to the left. No polyp, or purulence. Mucosal edema and erythema present.  Bilateral inferior turbinate hypertrophy.  Lips, Teeth, and gums: Mucosa and teeth intact and viable TMJ: No pain to palpation with full mobility Oral cavity/oropharynx: No erythema or exudate, no lesions present 1+ tonsillar hypertrophy Nasopharynx: No mass or lesion with intact mucosa Hypopharynx: Intact mucosa without pooling of secretions Larynx Glottic: Full true vocal cord mobility without lesion or mass Supraglottic: Normal appearing epiglottis and AE folds Interarytenoid Space: Moderate pachydermia&edema Subglottic Space: Patent without lesion or edema Neck Neck and Trachea: Midline trachea without mass or lesion Thyroid: No mass or nodularity Lymphatics: No lymphadenopathy  Procedure: Preoperative diagnosis: hx of tongue lesion left side, throat clearing   Postoperative diagnosis:   Same  Procedure: Flexible fiberoptic laryngoscopy  Surgeon: Ashok Croon, MD  Anesthesia: Topical lidocaine and Afrin Complications: None Condition is stable throughout exam  Indications and consent:  The patient presents to the clinic with throat clearing and history of left tongue lesion Indirect laryngoscopy view was incomplete. Thus it was recommended that they undergo a flexible fiberoptic laryngoscopy. All of the risks, benefits, and potential complications were reviewed with the patient preoperatively and verbal informed consent was obtained.  Procedure: The patient was seated upright in the clinic. Topical lidocaine and Afrin were applied to the nasal cavity. After adequate anesthesia had occurred, I then proceeded to  pass the flexible telescope into the nasal cavity. The nasal cavity was patent without rhinorrhea or polyp. The nasopharynx was also patent without mass or lesion. The base of tongue was visualized and was normal. There were no signs of pooling of secretions in the piriform sinuses. The true vocal folds were mobile bilaterally. There were no signs of glottic or supraglottic mucosal lesion or mass. There was moderate interarytenoid pachydermia and post cricoid edema. The telescope was then slowly withdrawn and the patient tolerated the procedure throughout.    PROCEDURE NOTE: nasal endoscopy  Preoperative diagnosis: chronic sinusitis symptoms  Postoperative diagnosis: same  Procedure: Diagnostic nasal endoscopy ([id number])  Surgeon: Ashok Croon, M.D.  Anesthesia: Topical lidocaine and Afrin  H&P REVIEW: The patient's history and physical were reviewed today prior to procedure. All medications were reviewed and updated as well. Complications: None Condition is stable throughout exam Indications and consent: The patient presents with symptoms of chronic sinusitis not responding to previous therapies. All the risks, benefits, and potential complications were reviewed with the patient preoperatively and informed consent was obtained. The time out was completed with confirmation of the correct procedure.   Procedure: The patient was seated upright in the clinic. Topical lidocaine and Afrin were applied to the nasal cavity. After adequate anesthesia had occurred, the rigid nasal endoscope was passed into the nasal cavity. The nasal mucosa, turbinates, septum, and sinus drainage pathways were visualized bilaterally. This revealed no purulence or significant secretions that might be cultured. There were no polyps or sites of significant inflammation. The mucosa was intact and there was no crusting  present. The scope was then slowly withdrawn and the patient tolerated the procedure well. There were no  complications or blood loss.   Studies Reviewed: none  Assessment/Plan: Encounter Diagnoses  Name Primary?   Chronic GERD Yes   Throat irritation [J39.2]    Chronic throat clearing [R09.89]    Globus sensation    Environmental and seasonal allergies    Post-nasal drip     Assessment and Plan    Globus Sensation/Throat discomfort and Postnasal Drip Frequent throat clearing and sinus drainage, especially during seasonal changes. Examination showed postnasal drainage and mild tonsillar hypertrophy. No significant nasal congestion. Discussed Flonase and Zyrtec for management. Treating postnasal drip may reduce throat irritation and uvula discomfort. - Recommend Flonase and Zyrtec. - Gargle with salt water for throat discomfort.  Uvula Irritation Sensation of uvula resting on the back of the tongue, causing discomfort. Examination showed a normal uvula. Uvula irritation could be due to postnasal drip or reflux. Reducing throat clearing and treating postnasal drip and reflux may alleviate symptoms. Discussed uvula trimming surgery for persistent discomfort, noting potential pain and impact on swallowing and voice. - Manage postnasal drip and reflux as outlined. - Gargle with salt water for throat irritation.  Chronic Gastroesophageal Reflux Disease (GERD) GERD managed with dietary modifications. Occasional food regurgitation and mild heartburn, especially after overeating. Examination showed mild to moderate post-cricoid edema. Discussed Reflux Gourmet, a seaweed-based supplement, as a natural alternative to Prilosec.  - Recommend Reflux Gourmet after meals, especially dinner. - Continue dietary modifications to avoid trigger foods.  Chronic nasal congestion  Nasal endoscopy with septal deviation/inferior turbinate hypertrophy, clear secretions but no mucopus or polyps - Zyrtec and Flonase as above  Resolved Tongue Lesion Painful lesion on the left side of the tongue noted in December  2024, now resolved. Examination revealed a normal taste bud papilla. No signs of oral cancer or other concerning findings such as lesion/leukoplakia. - No further action required.  General Health Maintenance Proactive about health and seeks regular check-ups. - Encourage continued proactive health monitoring and regular check-ups.     Thank you for allowing me to participate in the care of this patient. Please do not hesitate to contact me with any questions or concerns.   Ashok Croon, MD Otolaryngology [facility] Phone: [phone] Fax: [phone]    08/20/2023, 10:16 AM

## 2023-08-20 NOTE — Patient Instructions (Signed)

## 2023-09-25 ENCOUNTER — Encounter: Payer: Self-pay | Admitting: Internal Medicine

## 2023-12-25 DIAGNOSIS — Z809 Family history of malignant neoplasm, unspecified: Secondary | ICD-10-CM | POA: Diagnosis not present

## 2023-12-25 DIAGNOSIS — I499 Cardiac arrhythmia, unspecified: Secondary | ICD-10-CM | POA: Diagnosis not present

## 2023-12-25 DIAGNOSIS — K219 Gastro-esophageal reflux disease without esophagitis: Secondary | ICD-10-CM | POA: Diagnosis not present

## 2023-12-25 DIAGNOSIS — E785 Hyperlipidemia, unspecified: Secondary | ICD-10-CM | POA: Diagnosis not present

## 2023-12-25 DIAGNOSIS — Z8249 Family history of ischemic heart disease and other diseases of the circulatory system: Secondary | ICD-10-CM | POA: Diagnosis not present

## 2023-12-25 DIAGNOSIS — Z823 Family history of stroke: Secondary | ICD-10-CM | POA: Diagnosis not present

## 2023-12-25 DIAGNOSIS — J301 Allergic rhinitis due to pollen: Secondary | ICD-10-CM | POA: Diagnosis not present

## 2023-12-25 DIAGNOSIS — Z85828 Personal history of other malignant neoplasm of skin: Secondary | ICD-10-CM | POA: Diagnosis not present

## 2023-12-25 DIAGNOSIS — D84821 Immunodeficiency due to drugs: Secondary | ICD-10-CM | POA: Diagnosis not present

## 2023-12-25 DIAGNOSIS — N529 Male erectile dysfunction, unspecified: Secondary | ICD-10-CM | POA: Diagnosis not present

## 2023-12-25 DIAGNOSIS — Z5986 Financial insecurity: Secondary | ICD-10-CM | POA: Diagnosis not present

## 2023-12-25 DIAGNOSIS — R030 Elevated blood-pressure reading, without diagnosis of hypertension: Secondary | ICD-10-CM | POA: Diagnosis not present

## 2023-12-26 ENCOUNTER — Other Ambulatory Visit: Payer: Self-pay | Admitting: Family Medicine

## 2023-12-26 NOTE — Telephone Encounter (Signed)
 Last physical 06/26/22

## 2024-02-26 ENCOUNTER — Encounter: Payer: Self-pay | Admitting: Family Medicine

## 2024-02-29 ENCOUNTER — Other Ambulatory Visit: Payer: Self-pay

## 2024-02-29 DIAGNOSIS — K5120 Ulcerative (chronic) proctitis without complications: Secondary | ICD-10-CM

## 2024-03-03 ENCOUNTER — Ambulatory Visit (INDEPENDENT_AMBULATORY_CARE_PROVIDER_SITE_OTHER): Admitting: Family Medicine

## 2024-03-03 DIAGNOSIS — Z209 Contact with and (suspected) exposure to unspecified communicable disease: Secondary | ICD-10-CM | POA: Diagnosis not present

## 2024-03-03 DIAGNOSIS — R300 Dysuria: Secondary | ICD-10-CM | POA: Diagnosis not present

## 2024-03-03 DIAGNOSIS — Z125 Encounter for screening for malignant neoplasm of prostate: Secondary | ICD-10-CM

## 2024-03-03 NOTE — Progress Notes (Signed)
   Subjective:    Patient ID: Adrian Mcneil, male    DOB: October 03, 1965, 58 y.o.   MRN: [id number]  HPI He is here for evaluation of abdominal aching sensation in the genital area that can last just for a couple seconds and then go away.  This has been going on for at least the last 6 months and is not necessarily associated with sexual activity.  He does use Doxy prep.  He also has concerns about his prostate and would like to be tested for that.  His last sexual encounter was oral which was about 10 days ago.  No true discharge or dysuria.   Review of Systems     Objective:    Physical Exam  Abdominal exam shows a normal penis and testes.  Prostate was normal in size and nontender.      Assessment & Plan:  Dysuria  Contact with or exposure to communicable disease - Plan: RPR+HIV+GC+CT Panel, Ct/GC NAA, Pharyngeal  Screening for prostate cancer - Plan: PSA Again reinforced the need to do the Doxy prep.  I also then discussed prostate cancer screening with him and explained the sensitivity and specificity of PSA and the fact that an elevated PSA does not necessarily mean prostate cancer.  I went into great detail explaining screening and testing as well as therapies concerning this.

## 2024-03-03 NOTE — Addendum Note (Signed)
 Addended by: CLAUDENE COUNTRYMAN F on: 03/03/2024 03:49 PM   Modules accepted: Orders

## 2024-03-04 LAB — RPR+HIV+GC+CT PANEL
Chlamydia trachomatis, NAA: NEGATIVE
HIV Screen 4th Generation wRfx: NONREACTIVE
Neisseria Gonorrhoeae by PCR: NEGATIVE
RPR Ser Ql: NONREACTIVE

## 2024-03-04 LAB — PSA: Prostate Specific Ag, Serum: 1.4 ng/mL (ref 0.0–4.0)

## 2024-03-05 ENCOUNTER — Ambulatory Visit: Payer: Self-pay | Admitting: Family Medicine

## 2024-03-05 LAB — CT/GC NAA, PHARYNGEAL
C TRACH RRNA NPH QL PCR: NEGATIVE
N GONORRHOEA RRNA NPH QL PCR: NEGATIVE

## 2024-03-07 ENCOUNTER — Encounter: Payer: Self-pay | Admitting: Family Medicine

## 2024-03-07 ENCOUNTER — Other Ambulatory Visit: Payer: Self-pay

## 2024-03-07 ENCOUNTER — Ambulatory Visit (HOSPITAL_BASED_OUTPATIENT_CLINIC_OR_DEPARTMENT_OTHER)
Admission: RE | Admit: 2024-03-07 | Discharge: 2024-03-07 | Disposition: A | Discharge status: Home / Self Care | Source: Ambulatory Visit | Attending: Family Medicine | Admitting: Family Medicine

## 2024-03-07 ENCOUNTER — Ambulatory Visit: Admitting: Family Medicine

## 2024-03-07 ENCOUNTER — Encounter: Payer: Self-pay | Admitting: Internal Medicine

## 2024-03-07 VITALS — BP 114/76 | HR 86 | Ht 71.0 in | Wt 180.0 lb

## 2024-03-07 DIAGNOSIS — M2041 Other hammer toe(s) (acquired), right foot: Secondary | ICD-10-CM | POA: Diagnosis not present

## 2024-03-07 DIAGNOSIS — E785 Hyperlipidemia, unspecified: Secondary | ICD-10-CM | POA: Diagnosis not present

## 2024-03-07 DIAGNOSIS — K51919 Ulcerative colitis, unspecified with unspecified complications: Secondary | ICD-10-CM

## 2024-03-07 DIAGNOSIS — N5082 Scrotal pain: Secondary | ICD-10-CM | POA: Diagnosis not present

## 2024-03-07 DIAGNOSIS — Z23 Encounter for immunization: Secondary | ICD-10-CM

## 2024-03-07 DIAGNOSIS — Z800 Family history of malignant neoplasm of digestive organs: Secondary | ICD-10-CM | POA: Diagnosis not present

## 2024-03-07 DIAGNOSIS — M79671 Pain in right foot: Secondary | ICD-10-CM

## 2024-03-07 DIAGNOSIS — Z0000 Encounter for general adult medical examination without abnormal findings: Secondary | ICD-10-CM

## 2024-03-07 DIAGNOSIS — R3989 Other symptoms and signs involving the genitourinary system: Secondary | ICD-10-CM | POA: Diagnosis not present

## 2024-03-07 DIAGNOSIS — N503 Cyst of epididymis: Secondary | ICD-10-CM | POA: Diagnosis not present

## 2024-03-07 DIAGNOSIS — N433 Hydrocele, unspecified: Secondary | ICD-10-CM | POA: Diagnosis not present

## 2024-03-07 LAB — CBC WITH DIFFERENTIAL/PLATELET
Basophils Absolute: 0 x10E3/uL (ref 0.0–0.2)
Basos: 1 %
EOS (ABSOLUTE): 0.1 x10E3/uL (ref 0.0–0.4)
Eos: 1 %
Hematocrit: 44.9 % (ref 37.5–51.0)
Hemoglobin: 15.1 g/dL (ref 13.0–17.7)
Immature Grans (Abs): 0 x10E3/uL (ref 0.0–0.1)
Immature Granulocytes: 0 %
Lymphocytes Absolute: 1.6 x10E3/uL (ref 0.7–3.1)
Lymphs: 25 %
MCH: 31.4 pg (ref 26.6–33.0)
MCHC: 33.6 g/dL (ref 31.5–35.7)
MCV: 93 fL (ref 79–97)
Monocytes Absolute: 0.6 x10E3/uL (ref 0.1–0.9)
Monocytes: 9 %
Neutrophils Absolute: 4 x10E3/uL (ref 1.4–7.0)
Neutrophils: 64 %
Platelets: 255 x10E3/uL (ref 150–450)
RBC: 4.81 x10E6/uL (ref 4.14–5.80)
RDW: 12.6 % (ref 11.6–15.4)
WBC: 6.3 x10E3/uL (ref 3.4–10.8)

## 2024-03-07 LAB — LIPID PANEL

## 2024-03-07 MED ORDER — MESALAMINE 1.2 G PO TBEC
2.4000 g | DELAYED_RELEASE_TABLET | Freq: Every day | ORAL | 0 refills | Status: DC
Start: 2024-03-07 — End: 2024-03-10

## 2024-03-07 NOTE — Progress Notes (Signed)
 Name: Carlin JONELLE Holland   Date of Visit: 03/07/24   Date of last visit with me: Visit date not found   CHIEF COMPLAINT:  Chief Complaint  Patient presents with   Annual Exam    Cpe, fasting.        HPI:  Discussed the use of AI scribe software for clinical note transcription with the patient, who gave verbal consent to proceed.  History of Present Illness   BRANDY KABAT ROD is a 58 year old male who presents with penile and testicular discomfort.  He experiences achiness in the crotch area, particularly in the penile shaft, which sometimes feels tender to touch but resolves quickly. The sensation is described as a dull ache rather than sharp pain, occasionally occurring when he turns over in his sleep or hugs a pillow, causing a sharp or dull ache in the testicles. No relation to urination, discharge, or changes in ejaculation is noted.  He has a history of ulcerative colitis, diagnosed after a colonoscopy revealed inflammation in the lower intestine. He has been off mesalamine  for a year without major issues or flare-ups, although he previously experienced small flare-ups while on medication, typically triggered by certain foods.  He has been using sildenafil  as needed for about seven to eight years, approximately once a week. He denies experiencing prolonged erections or priapism and inquires about any potential link between its use and his penile discomfort.  He received a Tdap vaccine in 2021.  He reports a family history of pancreatic cancer, as his mother had the disease.  He experiences issues with his fourth toe, which tends to curl and cause pain, particularly when walking barefoot on hard surfaces. Wearing socks and shoes provides some relief.         OBJECTIVE:       03/07/2024    8:36 AM  Depression screen PHQ 2/9  Decreased Interest 0  Down, Depressed, Hopeless 0  PHQ - 2 Score 0     BP Readings from Last 3 Encounters:  03/07/24 114/76   08/20/23 (!) 154/79  07/12/23 124/72    BP 114/76   Pulse 86   Ht 5' 11 (1.803 m)   Wt 180 lb (81.6 kg)   SpO2 97%   BMI 25.10 kg/m    Physical Exam   MUSCULOSKELETAL: Fourth toe curling, not laying flat, possible hammer toe. Metatarsal valgus deformity. Hallux valgus and hallux rigidus.      Physical Exam Constitutional:      Appearance: Normal appearance.  Cardiovascular:     Rate and Rhythm: Normal rate.  Neurological:     General: No focal deficit present.     Mental Status: He is alert and oriented to person, place, and time. Mental status is at baseline.     ASSESSMENT/PLAN:   Assessment & Plan Annual physical exam  Urethral pain  Need for vaccination against Streptococcus pneumoniae  Need for shingles vaccine  Hyperlipidemia with target LDL less than 100  Right foot pain  Hammer toe of right foot  Ulcerative colitis with complication, unspecified location [facility])  Family history of malignant neoplasm of pancreas    Assessment and Plan    Urethral and penile pain Intermittent dull ache and tenderness in penile shaft and testicles. Differential includes urethral hyperesthesia, structural issues, or cysts. Sildenafil  use unlikely cause. No prostate cancer evidence from recent PSA and exam. - Order scrotal ultrasound for cysts or structural abnormalities. - Refer to [facility] for urethral pain evaluation,  ensure referral coded for urethral pain.  Ulcerative colitis No major flare-ups for a year post self-discontinuation of mesalamine . Initial diagnosis confirmed by colonoscopy. Recurrence risk 20-25%. Insurance and pill burden issues with mesalamine . - Prescribe mesalamine  1.2g extended-release tablets to reduce pill burden. - If insurance denies, consider [facility] for affordability. - Encourage follow-up with gastroenterologist Dr. Saintclair for management and insurance navigation.  Hyperlipidemia - Lipid panel, CBC, CMP  Hammer toe,  right fourth toe Intermittent pain and curling of right fourth toe, likely age-related metatarsal valgus deformity with hammer toe. No Morton's neuroma evidence. - Mild hammer toe of the right foot 4th digit.  - Refer to sports medicine clinic for orthotics evaluation to prevent deformity worsening and alleviate symptoms.         Athanasius Kesling A. Vita MD [facility]

## 2024-03-08 LAB — LIPASE: Lipase: 40 U/L (ref 13–78)

## 2024-03-08 LAB — COMPREHENSIVE METABOLIC PANEL WITH GFR
ALT: 16 IU/L (ref 0–44)
AST: 21 IU/L (ref 0–40)
Albumin: 4.2 g/dL (ref 3.8–4.9)
Alkaline Phosphatase: 70 IU/L (ref 44–121)
BUN/Creatinine Ratio: 14 (ref 9–20)
BUN: 16 mg/dL (ref 6–24)
Bilirubin Total: 0.6 mg/dL (ref 0.0–1.2)
CO2: 21 mmol/L (ref 20–29)
Calcium: 9.5 mg/dL (ref 8.7–10.2)
Chloride: 101 mmol/L (ref 96–106)
Creatinine, Ser: 1.15 mg/dL (ref 0.76–1.27)
Globulin, Total: 2.8 g/dL (ref 1.5–4.5)
Glucose: 84 mg/dL (ref 70–99)
Potassium: 4.6 mmol/L (ref 3.5–5.2)
Sodium: 138 mmol/L (ref 134–144)
Total Protein: 7 g/dL (ref 6.0–8.5)
eGFR: 75 mL/min/1.73 (ref >59)

## 2024-03-08 LAB — VITAMIN D 25 HYDROXY (VIT D DEFICIENCY, FRACTURES): Vit D, 25-Hydroxy: 26.5 ng/mL — ABNORMAL LOW (ref 30.0–100.0)

## 2024-03-08 LAB — LIPID PANEL
Chol/HDL Ratio: 4.5 ratio (ref 0.0–5.0)
Cholesterol, Total: 223 mg/dL — ABNORMAL HIGH (ref 100–199)
HDL: 50 mg/dL (ref >39)
LDL Chol Calc (NIH): 154 mg/dL — ABNORMAL HIGH (ref 0–99)
Triglycerides: 107 mg/dL (ref 0–149)
VLDL Cholesterol Cal: 19 mg/dL (ref 5–40)

## 2024-03-08 LAB — AMYLASE: Amylase: 66 U/L (ref 31–110)

## 2024-03-10 ENCOUNTER — Ambulatory Visit: Payer: Self-pay | Admitting: Family Medicine

## 2024-03-10 ENCOUNTER — Other Ambulatory Visit (HOSPITAL_COMMUNITY): Payer: Self-pay

## 2024-03-10 MED ORDER — MESALAMINE 1.2 G PO TBEC
2.4000 g | DELAYED_RELEASE_TABLET | Freq: Every day | ORAL | 0 refills | Status: DC
Start: 2024-03-10 — End: 2024-04-25
  Filled 2024-03-10: qty 60, 30d supply, fill #0

## 2024-03-10 NOTE — Addendum Note (Signed)
 Addended by: LATTIE CARLO BROCKS on: 03/10/2024 11:27 AM   Modules accepted: Orders

## 2024-03-19 ENCOUNTER — Ambulatory Visit: Admitting: Internal Medicine

## 2024-03-19 ENCOUNTER — Encounter: Payer: Self-pay | Admitting: Internal Medicine

## 2024-03-19 VITALS — BP 116/84 | Ht 71.0 in | Wt 175.0 lb

## 2024-03-19 DIAGNOSIS — M2041 Other hammer toe(s) (acquired), right foot: Secondary | ICD-10-CM

## 2024-03-19 DIAGNOSIS — M2142 Flat foot [pes planus] (acquired), left foot: Secondary | ICD-10-CM

## 2024-03-19 NOTE — Progress Notes (Signed)
 PCP: Joyce Norleen BROCKS, MD  Subjective:   HPI: Patient is a 58 y.o. male here for intermittent pain with cramping and curling of the fourth toe of the right foot.  This has occurred multiple times over the last month and a half.  It occurs both with walking barefoot and wearing the shoes he typically wears.  He describes a cramping sensation that resolves overnight.  Denies pain or symptoms currently. Recently seen by Dr. Vita who suspected this was due to mild hammertoe.  Referred for sports medicine clinic for evaluation and to discuss treatment options.  Past Medical History:  Diagnosis Date   Acid indigestion    Agatston coronary artery calcium  score less than 100    calcium  score 3.2 by CT 10/2022   Allergic rhinitis, cause unspecified    seasonal   Cancer (HCC)    skin   Dyslipidemia    Erectile dysfunction    FHx: cardiovascular disease    Hyperlipidemia    Ulcerative colitis    Dr. Celestia    Current Outpatient Medications on File Prior to Visit  Medication Sig Dispense Refill   cetirizine  (ZYRTEC ) 10 MG tablet Take 1 tablet (10 mg total) by mouth daily. 30 tablet 11   clindamycin  (CLINDAGEL) 1 % gel Apply topically daily. as directed 60 g 5   co-enzyme Q-10 30 MG capsule Take 30 mg by mouth daily. (Patient not taking: Reported on 03/07/2024)     doxycycline  (VIBRA -TABS) 100 MG tablet TAKE 2 TABLETS DAILY AS NEEDED FOR SEXUALLY ACTIVITY 28 tablet 2   fluticasone  (FLONASE ) 50 MCG/ACT nasal spray Place 2 sprays into both nostrils 2 (two) times daily. 16 g 6   mesalamine  (LIALDA ) 1.2 g EC tablet Take 2 tablets (2.4 g total) by mouth daily with breakfast. 60 tablet 0   Multiple Vitamins-Minerals (EMERGEN-C IMMUNE PO) Take by mouth.     Multiple Vitamins-Minerals (EYE VITAMINS PO) Take by mouth.     naproxen sodium (ALEVE) 220 MG tablet Take 220 mg by mouth.     omega-3 acid ethyl esters (LOVAZA) 1 g capsule Take 1 g by mouth daily.     omeprazole (PRILOSEC) 20 MG capsule Take 20 mg  by mouth daily.     rosuvastatin  (CRESTOR ) 20 MG tablet Take 1 tablet (20 mg total) by mouth daily. 90 tablet 3   sildenafil  (REVATIO ) 20 MG tablet TAKE UP TO 5 TABLETS BY MOUTH DAILY AS NEEDED FOR ERECTION 60 tablet 5   triamcinolone  cream (KENALOG ) 0.5 % APPLY 1 APPLICATION TOPICALLY 3 (THREE) TIMES DAILY. 30 g 0   TURMERIC PO Take 1 capsule by mouth.     valACYclovir  (VALTREX ) 1000 MG tablet TAKE 2 TABLETS (2,000 MG TOTAL) BY MOUTH TWICE A DAY 4 tablet 5   No current facility-administered medications on file prior to visit.    Past Surgical History:  Procedure Laterality Date   ANTERIOR CRUCIATE LIGAMENT REPAIR Right 08/19/2015   Procedure: RECONSTRUCTION ANTERIOR CRUCIATE LIGAMENT (ACL) WITH HAMSTRING GRAFT, PARTIAL MEDIAL MENISECTOMY.  ;  Surgeon: Glendia Cordella Hutchinson, MD;  Location: [facility] OR;  Service: Orthopedics;  Laterality: Right;   COLONOSCOPY  2007   Dr. Celestia    No Known Allergies  There were no vitals taken for this visit.      No data to display              No data to display              Objective:  Physical Exam:  Gen: NAD, comfortable in exam room  Bilateral Feet No acute deformity on inspection of both feet, including 4th digit right foot Mild metatarsal valgus noted bilaterally Splaying of digits 1-2 of left foot Medial arch collapse of left foot with standing Both feet are grossly NV intact    Assessment & Plan:  1.  Hammertoe fourth digit right foot Referred to [facility] for evaluation of intermittent cramping sensation in the fourth digit of the right foot with specific concern for hammertoe. On exam there is mild metatarsal valgus noted bilaterally.  We discussed that this increases the risk of developing hammertoe.  Medial arch collapse noted on the left as well.  Recommend improving foot support bilaterally to mitigate development of hammertoe or further discomfort.  With this in mind he was fitted for sports inserts today.  Given intermittent,  cramping pain in the fourth digit of right foot.  Recommended purchasing a hammertoe crest pad for as needed use.  He can return for follow-up on an as-needed basis.  Could consider custom orthotics if needed. -Fitted for sports inserts today -Recommend purchasing a hammertoe crest pad for as needed use. -Follow-up as needed

## 2024-04-17 ENCOUNTER — Encounter (INDEPENDENT_AMBULATORY_CARE_PROVIDER_SITE_OTHER): Payer: Self-pay | Admitting: Otolaryngology

## 2024-04-17 ENCOUNTER — Ambulatory Visit (INDEPENDENT_AMBULATORY_CARE_PROVIDER_SITE_OTHER): Admitting: Otolaryngology

## 2024-04-17 VITALS — BP 125/79 | HR 88 | Temp 98.6°F

## 2024-04-17 DIAGNOSIS — R439 Unspecified disturbances of smell and taste: Secondary | ICD-10-CM

## 2024-04-17 DIAGNOSIS — R0981 Nasal congestion: Secondary | ICD-10-CM

## 2024-04-17 DIAGNOSIS — J3089 Other allergic rhinitis: Secondary | ICD-10-CM

## 2024-04-17 DIAGNOSIS — J3489 Other specified disorders of nose and nasal sinuses: Secondary | ICD-10-CM

## 2024-04-17 DIAGNOSIS — J342 Deviated nasal septum: Secondary | ICD-10-CM

## 2024-04-17 DIAGNOSIS — J343 Hypertrophy of nasal turbinates: Secondary | ICD-10-CM

## 2024-04-17 DIAGNOSIS — R09A0 Foreign body sensation, unspecified: Secondary | ICD-10-CM

## 2024-04-17 DIAGNOSIS — R1310 Dysphagia, unspecified: Secondary | ICD-10-CM

## 2024-04-17 DIAGNOSIS — K219 Gastro-esophageal reflux disease without esophagitis: Secondary | ICD-10-CM | POA: Diagnosis not present

## 2024-04-17 DIAGNOSIS — R0982 Postnasal drip: Secondary | ICD-10-CM

## 2024-04-17 MED ORDER — FLUTICASONE PROPIONATE 50 MCG/ACT NA SUSP: 2.0000 | Freq: Two times a day (BID) | NASAL | 6 refills | Status: AC

## 2024-04-17 MED ORDER — LEVOCETIRIZINE DIHYDROCHLORIDE 5 MG PO TABS: 5.0000 mg | ORAL_TABLET | Freq: Every evening | ORAL | 3 refills | Status: AC

## 2024-04-17 MED ORDER — FAMOTIDINE 20 MG PO TABS: 20.0000 mg | ORAL_TABLET | Freq: Two times a day (BID) | ORAL | 1 refills | Status: DC

## 2024-04-17 NOTE — Patient Instructions (Addendum)
 [email or web address] - check out this website to learn more about reflux   -Avoid lying down for at least two hours after a meal or after drinking acidic beverages, like soda, or other caffeinated beverages. This can help to prevent stomach contents from flowing back into the esophagus. -Keep your head elevated while you sleep. Using an extra pillow or two can also help to prevent reflux. -Eat smaller and more frequent meals each day instead of a few large meals. This promotes digestion and can aid in preventing heartburn. -Wear loose-fitting clothes to ease pressure on the stomach, which can worsen heartburn and reflux. -Reduce excess weight around the midsection. This can ease pressure on the stomach. Such pressure can force some stomach contents back up the esophagus [email or web address] - check out this website to learn more about reflux   - Take Reflux Gourmet (natural supplement available on Amazon) to help with symptoms of chronic throat irritation

## 2024-04-17 NOTE — Progress Notes (Signed)
 ENT Progress Note:   Update 04/17/2024  Discussed the use of AI scribe software for clinical note transcription with the patient, who gave verbal consent to proceed.  History of Present Illness Adrian Mcneil is a 58 year old male who presents with phantom smells and throat irritation/sensation of globus he thinks is due to his uvula getting swollen after meals.  He has been experiencing phantom smells for the past five to six months, describing them as ranging from 'earthy, musky, or moldy' to 'sweet smelling.' These odors occur in various locations such as his house, bedroom, and car, and seem to follow him around. He speculates that these phantom smells might be related to his history of having COVID-19 four times, although he has not experienced loss of smell or taste.  He reports increased nasal congestion, particularly at night, with one nostril becoming clogged, though not completely obstructing his breathing. He has been using Zyrtec  and Flonase  as needed, but not consistently. He recently started taking a multivitamin for individuals aged 39 and above.  He experiences persistent irritation in the back of his throat, particularly after meals, which causes him to perform 'double swallows' to clear his throat. This sensation has been present for over ten years. He previously underwent a swallow test at [facility], which showed normal results. He has used Prilosec as needed for reflux symptoms.   Records Reviewed:  Initial Evaluation  Reason for Consult: history of the left tongue lesion throat discomfort  HPI: Discussed the use of AI scribe software for clinical note transcription with the patient, who gave verbal consent to proceed.  History of Present Illness   The patient, a 58 year old with a history of GERD, presented with a chief complaint of a lesion on the left side of the tongue, which was first noticed in December of the previous year, and has since resolved. The  lesion was described as a small, raised, flesh-colored papule that was sensitive to touch, particularly when brushing the tongue. The patient reported no associated pain, and the lesion has since resolved.  The patient denied any risk factors for oral cancer such as smoking, heavy alcohol use, or chewing tobacco. However, they expressed concern about potential oral cancer due to the lesion, prompting the consultation.  In addition to the tongue lesion, the patient reported a sensation of their uvula touching the back of their tongue, which they described as uncomfortable. They noted that this sensation seemed to occur more frequently when consuming certain liquids or creamy foods.  The patient also reported a history of acid reflux and GERD, for which they were previously treated with a course of Prilosec. They noted that symptoms of reflux tend to flare up when overeating, and they have made dietary modifications to manage this.  The patient denied any difficulty swallowing or sensation of food getting stuck. They also denied any other symptoms related to the throat, such as throat clearing or sensation of a lump.    Past Medical History:  Diagnosis Date   Acid indigestion    Agatston coronary artery calcium  score less than 100    calcium  score 3.2 by CT 10/2022   Allergic rhinitis, cause unspecified    seasonal   Cancer (HCC)    skin   Dyslipidemia    Erectile dysfunction    FHx: cardiovascular disease    Hyperlipidemia    Ulcerative colitis    Dr. Celestia    Past Surgical History:  Procedure Laterality Date   ANTERIOR  CRUCIATE LIGAMENT REPAIR Right 08/19/2015   Procedure: RECONSTRUCTION ANTERIOR CRUCIATE LIGAMENT (ACL) WITH HAMSTRING GRAFT, PARTIAL MEDIAL MENISECTOMY.  ;  Surgeon: Glendia Cordella Hutchinson, MD;  Location: [facility] OR;  Service: Orthopedics;  Laterality: Right;   COLONOSCOPY  2007   Dr. Celestia    Family History  Problem Relation Age of Onset   Hyperlipidemia Mother     Heart disease Father 46       MI   Cancer Sister        skin    Social History:  reports that he has never smoked. He has never used smokeless tobacco. He reports current alcohol use. He reports that he does not use drugs.  Allergies: No Known Allergies  Medications: I have reviewed the patient's current medications.  The PMH, PSH, Medications, Allergies, and SH were reviewed and updated.  ROS: Constitutional: Negative for fever, weight loss and weight gain. Cardiovascular: Negative for chest pain and dyspnea on exertion. Respiratory: Is not experiencing shortness of breath at rest. Gastrointestinal: Negative for nausea and vomiting. Neurological: Negative for headaches. Psychiatric: The patient is not nervous/anxious  Blood pressure 125/79, pulse 88, temperature 98.6 F (37 C), SpO2 95%.  PHYSICAL EXAM:  Exam: General: Well-developed, well-nourished Respiratory Respiratory effort: Equal inspiration and expiration without stridor Cardiovascular Peripheral Vascular: Warm extremities with equal color/perfusion Eyes: No nystagmus with equal extraocular motion bilaterally Neuro/Psych/Balance: Patient oriented to person, place, and time; Appropriate mood and affect; Gait is intact with no imbalance; Cranial nerves I-XII are intact Head and Face Inspection: Normocephalic and atraumatic without mass or lesion Palpation: Facial skeleton intact without bony stepoffs Salivary Glands: No mass or tenderness Facial Strength: Facial motility symmetric and full bilaterally ENT Pinna: External ear intact and fully developed External canal: Canal is patent with intact skin Tympanic Membrane: Clear and mobile External Nose: No scar or anatomic deformity Internal Nose: Septum is deviated to the left. No polyp, or purulence. Mucosal edema and erythema present.  Bilateral inferior turbinate hypertrophy.  Lips, Teeth, and gums: Mucosa and teeth intact and viable TMJ: No pain to palpation  with full mobility Oral cavity/oropharynx: No erythema or exudate, no lesions present 1+ tonsillar hypertrophy Nasopharynx: No mass or lesion with intact mucosa Hypopharynx: Intact mucosa without pooling of secretions Larynx Glottic: Full true vocal cord mobility without lesion or mass Supraglottic: Normal appearing epiglottis and AE folds Interarytenoid Space: Moderate pachydermia&edema Subglottic Space: Patent without lesion or edema Neck Neck and Trachea: Midline trachea without mass or lesion Thyroid: No mass or nodularity Lymphatics: No lymphadenopathy  PROCEDURE NOTE: nasal endoscopy  Preoperative diagnosis: chronic sinusitis symptoms  Postoperative diagnosis: same  Procedure: Diagnostic nasal endoscopy (68768)  Surgeon: Elena Larry, M.D.  Anesthesia: Topical lidocaine  and Afrin  H&P REVIEW: The patient's history and physical were reviewed today prior to procedure. All medications were reviewed and updated as well. Complications: None Condition is stable throughout exam Indications and consent: The patient presents with symptoms of chronic sinusitis not responding to previous therapies. All the risks, benefits, and potential complications were reviewed with the patient preoperatively and informed consent was obtained. The time out was completed with confirmation of the correct procedure.   Procedure: The patient was seated upright in the clinic. Topical lidocaine  and Afrin were applied to the nasal cavity. After adequate anesthesia had occurred, the rigid nasal endoscope was passed into the nasal cavity. The nasal mucosa, turbinates, septum, and sinus drainage pathways were visualized bilaterally. This revealed no purulence or significant secretions that might be cultured.  There were no polyps or sites of significant inflammation. The mucosa was intact and there was no crusting present. The scope was then slowly withdrawn and the patient tolerated the procedure well. There  were no complications or blood loss.   Studies Reviewed:  MBS 04/02/2018 Pt presents with normal oropharyngeal function with brisk mastication, timely swallow response, no penetration/aspiration, functional pharyngeal clearance. 13 mm barium pill swallowed without deficit. Brief sweep of esophagus revealed no barium retention. Continue current diet - no further recommendations.   Assessment/Plan: Encounter Diagnoses  Name Primary?   Sensory disorder of smell Yes   Chronic nasal congestion    Environmental and seasonal allergies    Post-nasal drip    Nasal obstruction      Assessment and Plan    Globus Sensation/Throat discomfort and Postnasal Drip Frequent throat clearing and sinus drainage, especially during seasonal changes. Examination showed postnasal drainage and mild tonsillar hypertrophy. No significant nasal congestion. Discussed Flonase  and Zyrtec  for management. Treating postnasal drip may reduce throat irritation and uvula discomfort. - Recommend Flonase  and Zyrtec . - Gargle with salt water  for throat discomfort.  Uvula Irritation Sensation of uvula resting on the back of the tongue, causing discomfort. Examination showed a normal uvula. Uvula irritation could be due to postnasal drip or reflux. Reducing throat clearing and treating postnasal drip and reflux may alleviate symptoms. Discussed uvula trimming surgery for persistent discomfort, noting potential pain and impact on swallowing and voice. - Manage postnasal drip and reflux as outlined. - Gargle with salt water  for throat irritation.  Chronic Gastroesophageal Reflux Disease (GERD) GERD managed with dietary modifications. Occasional food regurgitation and mild heartburn, especially after overeating. Examination showed mild to moderate post-cricoid edema. Discussed Reflux Gourmet, a seaweed-based supplement, as a natural alternative to Prilosec.  - Recommend Reflux Gourmet after meals, especially dinner. - Continue  dietary modifications to avoid trigger foods.  Chronic nasal congestion  Nasal endoscopy with septal deviation/inferior turbinate hypertrophy, clear secretions but no mucopus or polyps - Zyrtec  and Flonase  as above  Resolved Tongue Lesion Painful lesion on the left side of the tongue noted in December 2024, now resolved. Examination revealed a normal taste bud papilla. No signs of oral cancer or other concerning findings such as lesion/leukoplakia. - No further action required.  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 No nasal polyps observed. He has septal deviation and ITH and post-nasal drainage - Prescribed Xyzal  daily at night. - Instructed to use Flonase  nasal spray daily.  Globus and GERD LPR Throat irritation and globus sensation likely related to gastroesophageal reflux disease (GERD) Persistent throat sensation possibly due to GERD. Previous swallow test normal. Discussed uvula trimming but advised optimizing reflux control first. - Prescribed Pepcid  20 mg BID for reflux control. - Advised seaweed-based reflux treatment post-meals.  Uvular Irritation Sensation of uvula resting on the back of the tongue, causing discomfort. Examination showed a normal uvula. We discussed that his sx are more pronounced after eating. Will control GERD first before considering any surgical alterations to his uvula    Remigio Mcmillon, MD Otolaryngology [facility] Phone: [phone] Fax: [phone]    04/17/2024, 10:30 AM

## 2024-04-18 ENCOUNTER — Other Ambulatory Visit (HOSPITAL_COMMUNITY): Payer: Self-pay | Admitting: *Deleted

## 2024-04-18 DIAGNOSIS — R1310 Dysphagia, unspecified: Secondary | ICD-10-CM

## 2024-04-22 DIAGNOSIS — R6882 Decreased libido: Secondary | ICD-10-CM | POA: Diagnosis not present

## 2024-04-22 DIAGNOSIS — R102 Pelvic and perineal pain: Secondary | ICD-10-CM | POA: Diagnosis not present

## 2024-04-22 DIAGNOSIS — N5201 Erectile dysfunction due to arterial insufficiency: Secondary | ICD-10-CM | POA: Diagnosis not present

## 2024-04-24 ENCOUNTER — Ambulatory Visit (HOSPITAL_COMMUNITY)
Admission: RE | Admit: 2024-04-24 | Discharge: 2024-04-24 | Disposition: A | Discharge status: Home / Self Care | Source: Ambulatory Visit | Attending: Family Medicine | Admitting: Family Medicine

## 2024-04-24 ENCOUNTER — Ambulatory Visit (HOSPITAL_COMMUNITY)
Admission: RE | Admit: 2024-04-24 | Discharge: 2024-04-24 | Disposition: A | Discharge status: Home / Self Care | Source: Ambulatory Visit | Attending: Otolaryngology | Admitting: Otolaryngology

## 2024-04-24 ENCOUNTER — Other Ambulatory Visit (INDEPENDENT_AMBULATORY_CARE_PROVIDER_SITE_OTHER): Payer: Self-pay | Admitting: Otolaryngology

## 2024-04-24 DIAGNOSIS — J3489 Other specified disorders of nose and nasal sinuses: Secondary | ICD-10-CM

## 2024-04-24 DIAGNOSIS — R1310 Dysphagia, unspecified: Secondary | ICD-10-CM

## 2024-04-24 DIAGNOSIS — J343 Hypertrophy of nasal turbinates: Secondary | ICD-10-CM

## 2024-04-24 DIAGNOSIS — K224 Dyskinesia of esophagus: Secondary | ICD-10-CM | POA: Diagnosis not present

## 2024-04-24 DIAGNOSIS — R439 Unspecified disturbances of smell and taste: Secondary | ICD-10-CM

## 2024-04-24 DIAGNOSIS — J3089 Other allergic rhinitis: Secondary | ICD-10-CM

## 2024-04-24 DIAGNOSIS — J342 Deviated nasal septum: Secondary | ICD-10-CM

## 2024-04-24 DIAGNOSIS — R0981 Nasal congestion: Secondary | ICD-10-CM

## 2024-04-24 DIAGNOSIS — R0982 Postnasal drip: Secondary | ICD-10-CM

## 2024-04-25 ENCOUNTER — Other Ambulatory Visit (HOSPITAL_COMMUNITY): Payer: Self-pay

## 2024-04-25 ENCOUNTER — Other Ambulatory Visit: Payer: Self-pay | Admitting: Family Medicine

## 2024-04-25 ENCOUNTER — Ambulatory Visit (INDEPENDENT_AMBULATORY_CARE_PROVIDER_SITE_OTHER): Payer: Self-pay

## 2024-04-25 ENCOUNTER — Telehealth (INDEPENDENT_AMBULATORY_CARE_PROVIDER_SITE_OTHER): Payer: Self-pay

## 2024-04-25 ENCOUNTER — Other Ambulatory Visit (INDEPENDENT_AMBULATORY_CARE_PROVIDER_SITE_OTHER): Payer: Self-pay | Admitting: Otolaryngology

## 2024-04-25 ENCOUNTER — Encounter (INDEPENDENT_AMBULATORY_CARE_PROVIDER_SITE_OTHER): Payer: Self-pay | Admitting: Otolaryngology

## 2024-04-25 DIAGNOSIS — K222 Esophageal obstruction: Secondary | ICD-10-CM

## 2024-04-25 DIAGNOSIS — K51919 Ulcerative colitis, unspecified with unspecified complications: Secondary | ICD-10-CM

## 2024-04-25 MED ORDER — MESALAMINE 1.2 G PO TBEC
2.4000 g | DELAYED_RELEASE_TABLET | Freq: Every day | ORAL | 0 refills | Status: DC
  Filled 2024-04-25: qty 60, 30d supply, fill #0

## 2024-04-25 NOTE — Telephone Encounter (Signed)
 LVM for patient to call back regarding results

## 2024-04-25 NOTE — Telephone Encounter (Signed)
 Spoke to patient regarding results. Patient already has a GI at [facility]. He wanted a referral sent to [facility] with Dr. Estelita Manas. Please advise.

## 2024-04-25 NOTE — Telephone Encounter (Signed)
 Randine from [facility] called about the results for his esophagus.  Call back number is [phone]

## 2024-04-26 ENCOUNTER — Other Ambulatory Visit (INDEPENDENT_AMBULATORY_CARE_PROVIDER_SITE_OTHER): Payer: Self-pay | Admitting: Otolaryngology

## 2024-04-28 ENCOUNTER — Encounter (INDEPENDENT_AMBULATORY_CARE_PROVIDER_SITE_OTHER): Payer: Self-pay

## 2024-04-28 NOTE — Telephone Encounter (Signed)
 LVM for patient answering his question. I also let him know that I would send him a message via mychart.

## 2024-04-30 ENCOUNTER — Other Ambulatory Visit: Payer: Self-pay | Admitting: Family Medicine

## 2024-04-30 DIAGNOSIS — N529 Male erectile dysfunction, unspecified: Secondary | ICD-10-CM

## 2024-04-30 MED ORDER — SILDENAFIL CITRATE 20 MG PO TABS: ORAL_TABLET | ORAL | 5 refills | Status: AC

## 2024-05-14 ENCOUNTER — Encounter (INDEPENDENT_AMBULATORY_CARE_PROVIDER_SITE_OTHER): Payer: Self-pay | Admitting: Otolaryngology

## 2024-05-26 ENCOUNTER — Telehealth (INDEPENDENT_AMBULATORY_CARE_PROVIDER_SITE_OTHER): Payer: Self-pay | Admitting: Otolaryngology

## 2024-05-26 NOTE — Telephone Encounter (Signed)
 Left vm to r/s 08/04/24 appointment

## 2024-05-30 DIAGNOSIS — K5190 Ulcerative colitis, unspecified, without complications: Secondary | ICD-10-CM | POA: Diagnosis not present

## 2024-05-30 DIAGNOSIS — K222 Esophageal obstruction: Secondary | ICD-10-CM | POA: Diagnosis not present

## 2024-05-30 DIAGNOSIS — R1310 Dysphagia, unspecified: Secondary | ICD-10-CM | POA: Diagnosis not present

## 2024-06-02 ENCOUNTER — Other Ambulatory Visit: Payer: Self-pay | Admitting: Family Medicine

## 2024-06-02 DIAGNOSIS — Z85828 Personal history of other malignant neoplasm of skin: Secondary | ICD-10-CM | POA: Diagnosis not present

## 2024-06-02 DIAGNOSIS — L821 Other seborrheic keratosis: Secondary | ICD-10-CM | POA: Diagnosis not present

## 2024-06-02 DIAGNOSIS — D485 Neoplasm of uncertain behavior of skin: Secondary | ICD-10-CM | POA: Diagnosis not present

## 2024-06-02 DIAGNOSIS — L281 Prurigo nodularis: Secondary | ICD-10-CM | POA: Diagnosis not present

## 2024-06-02 DIAGNOSIS — K51919 Ulcerative colitis, unspecified with unspecified complications: Secondary | ICD-10-CM

## 2024-06-02 DIAGNOSIS — D225 Melanocytic nevi of trunk: Secondary | ICD-10-CM | POA: Diagnosis not present

## 2024-06-02 DIAGNOSIS — L723 Sebaceous cyst: Secondary | ICD-10-CM | POA: Diagnosis not present

## 2024-06-03 ENCOUNTER — Other Ambulatory Visit (HOSPITAL_COMMUNITY): Payer: Self-pay

## 2024-06-03 MED ORDER — MESALAMINE 1.2 G PO TBEC
2.4000 g | DELAYED_RELEASE_TABLET | Freq: Every day | ORAL | 0 refills | Status: DC
  Filled 2024-06-03: qty 60, 30d supply, fill #0

## 2024-06-24 DIAGNOSIS — D485 Neoplasm of uncertain behavior of skin: Secondary | ICD-10-CM | POA: Diagnosis not present

## 2024-06-24 DIAGNOSIS — L281 Prurigo nodularis: Secondary | ICD-10-CM | POA: Diagnosis not present

## 2024-06-24 DIAGNOSIS — L280 Lichen simplex chronicus: Secondary | ICD-10-CM | POA: Diagnosis not present

## 2024-07-15 ENCOUNTER — Encounter: Payer: Self-pay | Admitting: Family Medicine

## 2024-07-15 ENCOUNTER — Other Ambulatory Visit (HOSPITAL_COMMUNITY): Payer: Self-pay

## 2024-07-15 ENCOUNTER — Other Ambulatory Visit: Payer: Self-pay | Admitting: Family Medicine

## 2024-07-15 DIAGNOSIS — R1310 Dysphagia, unspecified: Secondary | ICD-10-CM | POA: Diagnosis not present

## 2024-07-15 DIAGNOSIS — K51919 Ulcerative colitis, unspecified with unspecified complications: Secondary | ICD-10-CM

## 2024-07-15 DIAGNOSIS — K2930 Chronic superficial gastritis without bleeding: Secondary | ICD-10-CM | POA: Diagnosis not present

## 2024-07-15 DIAGNOSIS — K5289 Other specified noninfective gastroenteritis and colitis: Secondary | ICD-10-CM | POA: Diagnosis not present

## 2024-07-15 DIAGNOSIS — K3189 Other diseases of stomach and duodenum: Secondary | ICD-10-CM | POA: Diagnosis not present

## 2024-07-15 DIAGNOSIS — K5100 Ulcerative (chronic) pancolitis without complications: Secondary | ICD-10-CM | POA: Diagnosis not present

## 2024-07-15 DIAGNOSIS — K2289 Other specified disease of esophagus: Secondary | ICD-10-CM | POA: Diagnosis not present

## 2024-07-15 DIAGNOSIS — K648 Other hemorrhoids: Secondary | ICD-10-CM | POA: Diagnosis not present

## 2024-07-15 DIAGNOSIS — K222 Esophageal obstruction: Secondary | ICD-10-CM | POA: Diagnosis not present

## 2024-07-15 DIAGNOSIS — K6389 Other specified diseases of intestine: Secondary | ICD-10-CM | POA: Diagnosis not present

## 2024-07-15 LAB — HM COLONOSCOPY

## 2024-07-15 MED ORDER — MESALAMINE 1.2 G PO TBEC: 2.4000 g | DELAYED_RELEASE_TABLET | Freq: Every day | ORAL | 1 refills | Status: DC

## 2024-07-15 MED ORDER — MESALAMINE 1.2 G PO TBEC
2.4000 g | DELAYED_RELEASE_TABLET | Freq: Every day | ORAL | 1 refills | Status: DC
  Filled 2024-07-15 (×3): qty 60, 30d supply, fill #0

## 2024-07-16 ENCOUNTER — Telehealth: Payer: Self-pay | Admitting: Family Medicine

## 2024-07-16 NOTE — Telephone Encounter (Signed)
 Copied from CRM #[id number]. Topic: Clinical - Prescription Issue >> Jul 16, 2024 12:29 PM Laymon HERO wrote: Reason for CRM: [facility] # [phone] -needing mesalamine  (LIALDA ) 1.2 g EC tablet for 90 days- 180 pills to be CALLED in by Dr Vita in order for it to be sent by mail.

## 2024-07-17 ENCOUNTER — Other Ambulatory Visit: Payer: Self-pay | Admitting: *Deleted

## 2024-07-17 DIAGNOSIS — K51919 Ulcerative colitis, unspecified with unspecified complications: Secondary | ICD-10-CM

## 2024-07-17 MED ORDER — MESALAMINE 1.2 G PO TBEC: 2.4000 g | DELAYED_RELEASE_TABLET | Freq: Every day | ORAL | 1 refills | Status: AC

## 2024-07-21 NOTE — Telephone Encounter (Signed)
 Adrian Mcneil has taken care of this last week.

## 2024-08-04 ENCOUNTER — Ambulatory Visit (INDEPENDENT_AMBULATORY_CARE_PROVIDER_SITE_OTHER): Admitting: Otolaryngology

## 2024-08-04 ENCOUNTER — Ambulatory Visit (INDEPENDENT_AMBULATORY_CARE_PROVIDER_SITE_OTHER)

## 2024-08-20 ENCOUNTER — Ambulatory Visit (INDEPENDENT_AMBULATORY_CARE_PROVIDER_SITE_OTHER)

## 2024-09-02 ENCOUNTER — Other Ambulatory Visit: Payer: Self-pay

## 2024-09-04 ENCOUNTER — Other Ambulatory Visit: Payer: Self-pay | Admitting: Family Medicine

## 2024-09-04 MED ORDER — DOXYCYCLINE HYCLATE 100 MG PO TABS: ORAL_TABLET | ORAL | 2 refills | Status: AC

## 2024-09-10 ENCOUNTER — Ambulatory Visit (INDEPENDENT_AMBULATORY_CARE_PROVIDER_SITE_OTHER)
